# Patient Record
Sex: Female | Born: 1975 | Race: Black or African American | Hispanic: No | Marital: Single | Smoking: Never smoker
Health system: Southern US, Community
[De-identification: ages and names within clinical notes are randomized; demographics above are authoritative.]

## PROBLEM LIST (undated history)

## (undated) ENCOUNTER — Emergency Department (HOSPITAL_COMMUNITY): Admission: EM | Discharge status: Absent without leave | Payer: Managed Care, Other (non HMO)

## (undated) DIAGNOSIS — O09529 Supervision of elderly multigravida, unspecified trimester: Secondary | ICD-10-CM

## (undated) DIAGNOSIS — O139 Gestational [pregnancy-induced] hypertension without significant proteinuria, unspecified trimester: Secondary | ICD-10-CM

## (undated) DIAGNOSIS — Z9889 Other specified postprocedural states: Secondary | ICD-10-CM

## (undated) DIAGNOSIS — R569 Unspecified convulsions: Secondary | ICD-10-CM

## (undated) HISTORY — PX: FOOT SURGERY: SHX648

## (undated) HISTORY — DX: Supervision of elderly multigravida, unspecified trimester: O09.529

## (undated) HISTORY — DX: Other specified postprocedural states: Z98.890

---

## 1997-12-21 ENCOUNTER — Emergency Department (HOSPITAL_COMMUNITY): Admission: EM | Admit: 1997-12-21 | Discharge: 1997-12-21 | Payer: Self-pay | Admitting: Emergency Medicine

## 1997-12-25 ENCOUNTER — Encounter: Admission: RE | Admit: 1997-12-25 | Discharge: 1997-12-25 | Payer: Self-pay | Admitting: *Deleted

## 1998-09-12 ENCOUNTER — Emergency Department (HOSPITAL_COMMUNITY): Admission: EM | Admit: 1998-09-12 | Discharge: 1998-09-12 | Payer: Self-pay | Admitting: Emergency Medicine

## 1998-11-18 ENCOUNTER — Emergency Department (HOSPITAL_COMMUNITY): Admission: EM | Admit: 1998-11-18 | Discharge: 1998-11-18 | Payer: Self-pay | Admitting: Emergency Medicine

## 1998-11-18 ENCOUNTER — Encounter: Payer: Self-pay | Admitting: Emergency Medicine

## 1999-10-12 ENCOUNTER — Emergency Department (HOSPITAL_COMMUNITY): Admission: EM | Admit: 1999-10-12 | Discharge: 1999-10-12 | Payer: Self-pay | Admitting: Emergency Medicine

## 1999-12-14 ENCOUNTER — Encounter: Payer: Self-pay | Admitting: Emergency Medicine

## 1999-12-14 ENCOUNTER — Emergency Department (HOSPITAL_COMMUNITY): Admission: EM | Admit: 1999-12-14 | Discharge: 1999-12-15 | Payer: Self-pay | Admitting: Emergency Medicine

## 2000-02-09 ENCOUNTER — Emergency Department (HOSPITAL_COMMUNITY): Admission: EM | Admit: 2000-02-09 | Discharge: 2000-02-09 | Payer: Self-pay | Admitting: Emergency Medicine

## 2000-05-22 ENCOUNTER — Emergency Department (HOSPITAL_COMMUNITY): Admission: EM | Admit: 2000-05-22 | Discharge: 2000-05-22 | Payer: Self-pay | Admitting: Emergency Medicine

## 2000-09-18 ENCOUNTER — Encounter: Payer: Self-pay | Admitting: Emergency Medicine

## 2000-09-18 ENCOUNTER — Emergency Department (HOSPITAL_COMMUNITY): Admission: EM | Admit: 2000-09-18 | Discharge: 2000-09-18 | Payer: Self-pay | Admitting: Emergency Medicine

## 2001-02-27 ENCOUNTER — Encounter: Payer: Self-pay | Admitting: Emergency Medicine

## 2001-02-27 ENCOUNTER — Emergency Department (HOSPITAL_COMMUNITY): Admission: EM | Admit: 2001-02-27 | Discharge: 2001-02-27 | Payer: Self-pay | Admitting: Emergency Medicine

## 2001-09-20 ENCOUNTER — Other Ambulatory Visit: Admission: RE | Admit: 2001-09-20 | Discharge: 2001-09-20 | Payer: Self-pay | Admitting: Obstetrics and Gynecology

## 2002-10-21 ENCOUNTER — Emergency Department (HOSPITAL_COMMUNITY): Admission: EM | Admit: 2002-10-21 | Discharge: 2002-10-21 | Payer: Self-pay | Admitting: *Deleted

## 2002-10-22 ENCOUNTER — Other Ambulatory Visit: Admission: RE | Admit: 2002-10-22 | Discharge: 2002-10-22 | Payer: Self-pay | Admitting: *Deleted

## 2003-12-22 ENCOUNTER — Other Ambulatory Visit: Admission: RE | Admit: 2003-12-22 | Discharge: 2003-12-22 | Payer: Self-pay | Admitting: Obstetrics and Gynecology

## 2004-04-14 ENCOUNTER — Emergency Department (HOSPITAL_COMMUNITY): Admission: EM | Admit: 2004-04-14 | Discharge: 2004-04-14 | Payer: Self-pay | Admitting: Emergency Medicine

## 2004-09-02 ENCOUNTER — Emergency Department (HOSPITAL_COMMUNITY): Admission: EM | Admit: 2004-09-02 | Discharge: 2004-09-02 | Payer: Self-pay | Admitting: Emergency Medicine

## 2005-02-15 ENCOUNTER — Other Ambulatory Visit: Admission: RE | Admit: 2005-02-15 | Discharge: 2005-02-15 | Payer: Self-pay | Admitting: Obstetrics and Gynecology

## 2005-06-28 ENCOUNTER — Emergency Department (HOSPITAL_COMMUNITY): Admission: EM | Admit: 2005-06-28 | Discharge: 2005-06-28 | Payer: Self-pay | Admitting: Emergency Medicine

## 2008-06-29 ENCOUNTER — Emergency Department (HOSPITAL_COMMUNITY): Admission: EM | Admit: 2008-06-29 | Discharge: 2008-06-29 | Payer: Self-pay | Admitting: Family Medicine

## 2009-09-20 ENCOUNTER — Encounter: Admission: RE | Admit: 2009-09-20 | Discharge: 2009-09-20 | Payer: Self-pay | Admitting: Family Medicine

## 2011-06-06 LAB — POCT URINALYSIS DIP (DEVICE)
Bilirubin Urine: NEGATIVE
Hgb urine dipstick: NEGATIVE
Nitrite: NEGATIVE
Operator id: 12649
Protein, ur: NEGATIVE

## 2011-06-06 LAB — COMPREHENSIVE METABOLIC PANEL
AST: 16
Albumin: 3.8
BUN: 8
Calcium: 9.2
Creatinine, Ser: 0.65
GFR calc Af Amer: >60
Glucose, Bld: 90
Sodium: 140

## 2011-06-06 LAB — POCT I-STAT, CHEM 8
HCT: 39
Hemoglobin: 13.3
Potassium: 4.1
TCO2: 25

## 2011-06-06 LAB — DIFFERENTIAL
Basophils Relative: 0
Lymphocytes Relative: 27
Monocytes Absolute: 0.7
Monocytes Relative: 6
Neutro Abs: 7.9 — ABNORMAL HIGH

## 2011-06-06 LAB — CBC
MCV: 83.2
RBC: 4.53
RDW: 13.6
WBC: 12 — ABNORMAL HIGH

## 2011-06-06 LAB — LIPASE, BLOOD: Lipase: 22

## 2011-06-06 LAB — POCT PREGNANCY, URINE: Preg Test, Ur: NEGATIVE

## 2011-10-31 ENCOUNTER — Emergency Department (HOSPITAL_COMMUNITY)
Admission: EM | Admit: 2011-10-31 | Discharge: 2011-10-31 | Disposition: A | Discharge status: Home Health | Payer: Self-pay | Attending: Emergency Medicine | Admitting: Emergency Medicine

## 2011-10-31 ENCOUNTER — Encounter (HOSPITAL_COMMUNITY): Payer: Self-pay

## 2011-10-31 DIAGNOSIS — R51 Headache: Secondary | ICD-10-CM | POA: Insufficient documentation

## 2011-10-31 DIAGNOSIS — Z79899 Other long term (current) drug therapy: Secondary | ICD-10-CM | POA: Insufficient documentation

## 2011-10-31 DIAGNOSIS — J329 Chronic sinusitis, unspecified: Secondary | ICD-10-CM

## 2011-10-31 DIAGNOSIS — R209 Unspecified disturbances of skin sensation: Secondary | ICD-10-CM | POA: Insufficient documentation

## 2011-10-31 DIAGNOSIS — H5789 Other specified disorders of eye and adnexa: Secondary | ICD-10-CM | POA: Insufficient documentation

## 2011-10-31 DIAGNOSIS — R0609 Other forms of dyspnea: Secondary | ICD-10-CM | POA: Insufficient documentation

## 2011-10-31 DIAGNOSIS — R0989 Other specified symptoms and signs involving the circulatory and respiratory systems: Secondary | ICD-10-CM | POA: Insufficient documentation

## 2011-10-31 DIAGNOSIS — G44209 Tension-type headache, unspecified, not intractable: Secondary | ICD-10-CM

## 2011-10-31 DIAGNOSIS — J3489 Other specified disorders of nose and nasal sinuses: Secondary | ICD-10-CM | POA: Insufficient documentation

## 2011-10-31 MED ORDER — IBUPROFEN 800 MG PO TABS: 800.0000 mg | ORAL_TABLET | Freq: Once | ORAL | Status: DC

## 2011-10-31 MED ORDER — LORATADINE 10 MG PO TABS: 10.0000 mg | ORAL_TABLET | Freq: Every day | ORAL | Status: DC

## 2011-10-31 MED ORDER — AZITHROMYCIN 250 MG PO TABS: 250.0000 mg | ORAL_TABLET | Freq: Every day | ORAL | Status: AC
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
## 2011-10-31 NOTE — ED Notes (Signed)
Also report pressure in ear, nauseous, no vomiting

## 2011-10-31 NOTE — Discharge Instructions (Signed)
Sinusitis Sinuses are air pockets within the bones of your face. The growth of bacteria within a sinus leads to infection. The infection prevents the sinuses from draining. This infection is called sinusitis. SYMPTOMS  There will be different areas of pain depending on which sinuses have become infected.  The maxillary sinuses often produce pain beneath the eyes.   Frontal sinusitis may cause pain in the middle of the forehead and above the eyes.  Other problems (symptoms) include:  Toothaches.   Colored, pus-like (purulent) drainage from the nose.   Swelling, warmth, and tenderness over the sinus areas may be signs of infection.  TREATMENT  Sinusitis is most often determined by an exam.X-rays may be taken. If x-rays have been taken, make sure you obtain your results or find out how you are to obtain them. Your caregiver may give you medications (antibiotics). These are medications that will help kill the bacteria causing the infection. You may also be given a medication (decongestant) that helps to reduce sinus swelling.  HOME CARE INSTRUCTIONS   Only take over-the-counter or prescription medicines for pain, discomfort, or fever as directed by your caregiver.   Drink extra fluids. Fluids help thin the mucus so your sinuses can drain more easily.   Applying either moist heat or ice packs to the sinus areas may help relieve discomfort.   Use saline nasal sprays to help moisten your sinuses. The sprays can be found at your local drugstore.  SEEK IMMEDIATE MEDICAL CARE IF:  You have a fever.   You have increasing pain, severe headaches, or toothache.   You have nausea, vomiting, or drowsiness.   You develop unusual swelling around the face or trouble seeing.  MAKE SURE YOU:   Understand these instructions.   Will watch your condition.   Will get help right away if you are not doing well or get worse.  Document Released: 08/21/2005 Document Revised: 05/03/2011 Document Reviewed:  03/20/2007 [facility] Patient Information 2012 [place], [place].    Tension Headache (Muscle Contraction Headache) Tension headache is one of the most common causes of head pain. These headaches are usually felt as a pain over the top of your head and back of your neck. Stress, anxiety, and depression are common triggers for these headaches. Tension headaches are not life-threatening and will not lead to other types of headaches. Tension headaches can often be diagnosed by taking a history from the patient and a physical exam. Sometimes, further lab and x-ray studies are used to confirm the diagnosis. Your caregiver can advise you on how to get help solving problems that cause anxiety or stress.  HOME CARE INSTRUCTIONS   If testing was done, call for your results. Remember, it is your responsibility to get the results of all testing. Do not assume everything is fine because you do not hear from your caregiver.   Only take over-the-counter or prescription medicines for pain, discomfort, or fever as directed by your caregiver.   Biofeedback, massage, or other relaxation techniques may be helpful.   Ice packs or heat to the head and neck can be used. Use these three to four times per day or as needed.   Physical therapy may be a useful addition to treatment.   If headaches continue, even with therapy, you may need to think about lifestyle changes.   Avoid excessive use of pain killers, as rebound headaches can occur.  SEEK MEDICAL CARE IF:   You develop problems with medications prescribed.   You do not  respond or get no relief from medications.   You have a change from the usual headache.   You develop nausea (feeling sick to your stomach) or vomiting.  SEEK IMMEDIATE MEDICAL CARE IF:   Your headache becomes severe.   You have an unexplained oral temperature above 102 F (38.9 C).   You develop a stiff neck.   You have loss of vision.   You have muscular weakness.   You have  loss of muscular control.   You develop severe symptoms different from your first symptoms.   You start losing your balance or have trouble walking.   You feel faint or pass out.  MAKE SURE YOU:   Understand these instructions.   Will watch your condition.   Will get help right away if you are not doing well or get worse.  Document Released: 08/21/2005 Document Revised: 05/03/2011 Document Reviewed: 04/09/2008 [facility] Patient Information 2012 [place], [place].

## 2011-10-31 NOTE — ED Notes (Signed)
Pt reports she may have sinusitis, sts she has cold sensation on her frontal area x 2-3 month. Denied headache. Report vision changes today.

## 2012-12-12 ENCOUNTER — Other Ambulatory Visit: Payer: Self-pay | Admitting: Gastroenterology

## 2012-12-12 DIAGNOSIS — R109 Unspecified abdominal pain: Secondary | ICD-10-CM

## 2012-12-19 ENCOUNTER — Ambulatory Visit
Admission: RE | Admit: 2012-12-19 | Discharge: 2012-12-19 | Disposition: A | Discharge status: Home / Self Care | Payer: Managed Care, Other (non HMO) | Source: Ambulatory Visit | Attending: Gastroenterology | Admitting: Gastroenterology

## 2012-12-19 DIAGNOSIS — R109 Unspecified abdominal pain: Secondary | ICD-10-CM

## 2013-09-04 NOTE — L&D Delivery Note (Signed)
Pt presented in labor. She wanted to attempt a VBAC. She rapidly progressed to C/C/+2. She had SROM with mod mec . She had occ variable decels with labor. She pushed briefly and had a SVD of one live viable infant in the ROA postion over an intact perineum. She had a left labial tear repaired with 3-0 chromic. Placenta S/I . EBL-400cc. Baby to NBN.

## 2013-11-13 ENCOUNTER — Other Ambulatory Visit: Payer: Self-pay | Admitting: Obstetrics and Gynecology

## 2013-11-13 DIAGNOSIS — N6320 Unspecified lump in the left breast, unspecified quadrant: Secondary | ICD-10-CM

## 2013-11-13 DIAGNOSIS — N644 Mastodynia: Secondary | ICD-10-CM

## 2013-11-24 ENCOUNTER — Ambulatory Visit
Admission: RE | Admit: 2013-11-24 | Discharge: 2013-11-24 | Disposition: A | Discharge status: Home / Self Care | Payer: Managed Care, Other (non HMO) | Source: Ambulatory Visit | Attending: Obstetrics and Gynecology | Admitting: Obstetrics and Gynecology

## 2013-11-24 DIAGNOSIS — N6320 Unspecified lump in the left breast, unspecified quadrant: Secondary | ICD-10-CM

## 2013-11-24 DIAGNOSIS — N644 Mastodynia: Secondary | ICD-10-CM

## 2014-01-28 LAB — OB RESULTS CONSOLE ABO/RH: RH Type: POSITIVE

## 2014-01-28 LAB — OB RESULTS CONSOLE ANTIBODY SCREEN: Antibody Screen: NEGATIVE

## 2014-01-28 LAB — OB RESULTS CONSOLE HEPATITIS B SURFACE ANTIGEN: Hepatitis B Surface Ag: NEGATIVE

## 2014-01-28 LAB — OB RESULTS CONSOLE RUBELLA ANTIBODY, IGM: Rubella: IMMUNE

## 2014-01-28 LAB — OB RESULTS CONSOLE RPR: RPR: NONREACTIVE

## 2014-01-28 LAB — OB RESULTS CONSOLE HIV ANTIBODY (ROUTINE TESTING): HIV: NONREACTIVE

## 2014-05-19 LAB — OB RESULTS CONSOLE RPR: RPR: NONREACTIVE

## 2014-08-07 LAB — OB RESULTS CONSOLE GBS: GBS: NEGATIVE

## 2014-08-18 ENCOUNTER — Inpatient Hospital Stay (HOSPITAL_COMMUNITY)
Admission: AD | Admit: 2014-08-18 | Discharge: 2014-08-20 | DRG: 775 | Disposition: A | Discharge status: Home / Self Care | Payer: Managed Care, Other (non HMO) | Source: Ambulatory Visit | Attending: Obstetrics and Gynecology | Admitting: Obstetrics and Gynecology

## 2014-08-18 ENCOUNTER — Encounter (HOSPITAL_COMMUNITY): Payer: Self-pay

## 2014-08-18 DIAGNOSIS — Z3480 Encounter for supervision of other normal pregnancy, unspecified trimester: Secondary | ICD-10-CM

## 2014-08-18 DIAGNOSIS — O09523 Supervision of elderly multigravida, third trimester: Secondary | ICD-10-CM | POA: Diagnosis not present

## 2014-08-18 DIAGNOSIS — O3421 Maternal care for scar from previous cesarean delivery: Secondary | ICD-10-CM | POA: Diagnosis present

## 2014-08-18 DIAGNOSIS — IMO0001 Reserved for inherently not codable concepts without codable children: Secondary | ICD-10-CM

## 2014-08-18 DIAGNOSIS — Z3483 Encounter for supervision of other normal pregnancy, third trimester: Secondary | ICD-10-CM

## 2014-08-18 DIAGNOSIS — O34219 Maternal care for unspecified type scar from previous cesarean delivery: Secondary | ICD-10-CM

## 2014-08-18 DIAGNOSIS — Z3A39 39 weeks gestation of pregnancy: Secondary | ICD-10-CM | POA: Diagnosis present

## 2014-08-18 HISTORY — DX: Gestational (pregnancy-induced) hypertension without significant proteinuria, unspecified trimester: O13.9

## 2014-08-18 HISTORY — DX: Unspecified convulsions: R56.9

## 2014-08-18 LAB — CBC
HCT: 40 % (ref 36.0–46.0)
HEMOGLOBIN: 13.4 g/dL (ref 12.0–15.0)
MCH: 28.9 pg (ref 26.0–34.0)
MCHC: 33.5 g/dL (ref 30.0–36.0)
MCV: 86.4 fL (ref 78.0–100.0)
Platelets: 248 10*3/uL (ref 150–400)
RBC: 4.63 MIL/uL (ref 3.87–5.11)
RDW: 14.1 % (ref 11.5–15.5)
WBC: 18.4 10*3/uL — AB (ref 4.0–10.5)

## 2014-08-18 LAB — ABO/RH: ABO/RH(D): O POS

## 2014-08-18 LAB — RPR

## 2014-08-18 LAB — TYPE AND SCREEN
ABO/RH(D): O POS
Antibody Screen: NEGATIVE

## 2014-08-18 MED ORDER — OXYTOCIN BOLUS FROM INFUSION
500.0000 mL | INTRAVENOUS | Status: DC
  Administered 2014-08-18: 500 mL via INTRAVENOUS

## 2014-08-18 MED ORDER — OXYCODONE-ACETAMINOPHEN 5-325 MG PO TABS: 2.0000 | ORAL_TABLET | ORAL | Status: DC | PRN

## 2014-08-18 MED ORDER — DIBUCAINE 1 % RE OINT
1.0000 "application " | TOPICAL_OINTMENT | RECTAL | Status: DC | PRN
  Administered 2014-08-20: 1 via RECTAL
  Filled 2014-08-18: qty 28

## 2014-08-18 MED ORDER — ONDANSETRON HCL 4 MG/2ML IJ SOLN
4.0000 mg | INTRAMUSCULAR | Status: DC | PRN
Start: 2014-08-18 — End: 2014-08-20

## 2014-08-18 MED ORDER — FLEET ENEMA 7-19 GM/118ML RE ENEM: 1.0000 | ENEMA | RECTAL | Status: DC | PRN

## 2014-08-18 MED ORDER — TETANUS-DIPHTH-ACELL PERTUSSIS 5-2.5-18.5 LF-MCG/0.5 IM SUSP: 0.5000 mL | Freq: Once | INTRAMUSCULAR | Status: DC

## 2014-08-18 MED ORDER — SIMETHICONE 80 MG PO CHEW: 80.0000 mg | CHEWABLE_TABLET | ORAL | Status: DC | PRN

## 2014-08-18 MED ORDER — ONDANSETRON HCL 4 MG/2ML IJ SOLN: 4.0000 mg | Freq: Four times a day (QID) | INTRAMUSCULAR | Status: DC | PRN

## 2014-08-18 MED ORDER — ACETAMINOPHEN 325 MG PO TABS: 650.0000 mg | ORAL_TABLET | ORAL | Status: DC | PRN

## 2014-08-18 MED ORDER — LACTATED RINGERS IV SOLN
INTRAVENOUS | Status: DC
  Administered 2014-08-18: 12:00:00 via INTRAVENOUS

## 2014-08-18 MED ORDER — CITRIC ACID-SODIUM CITRATE 334-500 MG/5ML PO SOLN: 30.0000 mL | ORAL | Status: DC | PRN

## 2014-08-18 MED ORDER — ZOLPIDEM TARTRATE 5 MG PO TABS: 5.0000 mg | ORAL_TABLET | Freq: Every evening | ORAL | Status: DC | PRN

## 2014-08-18 MED ORDER — LIDOCAINE HCL (PF) 1 % IJ SOLN
30.0000 mL | INTRAMUSCULAR | Status: DC | PRN
  Administered 2014-08-18: 30 mL via SUBCUTANEOUS
  Filled 2014-08-18: qty 30

## 2014-08-18 MED ORDER — MEASLES, MUMPS & RUBELLA VAC ~~LOC~~ INJ: 0.5000 mL | INJECTION | Freq: Once | SUBCUTANEOUS | Status: DC

## 2014-08-18 MED ORDER — BENZOCAINE-MENTHOL 20-0.5 % EX AERO
1.0000 "application " | INHALATION_SPRAY | CUTANEOUS | Status: DC | PRN
  Administered 2014-08-18 – 2014-08-20 (×2): 1 via TOPICAL
  Filled 2014-08-18 (×3): qty 56

## 2014-08-18 MED ORDER — IBUPROFEN 600 MG PO TABS
600.0000 mg | ORAL_TABLET | Freq: Four times a day (QID) | ORAL | Status: DC
  Administered 2014-08-18 – 2014-08-20 (×7): 600 mg via ORAL
  Filled 2014-08-18 (×8): qty 1

## 2014-08-18 MED ORDER — LACTATED RINGERS IV SOLN: 500.0000 mL | INTRAVENOUS | Status: DC | PRN

## 2014-08-18 MED ORDER — OXYCODONE-ACETAMINOPHEN 5-325 MG PO TABS
1.0000 | ORAL_TABLET | ORAL | Status: DC | PRN
  Administered 2014-08-19 – 2014-08-20 (×2): 1 via ORAL
  Filled 2014-08-18 (×2): qty 1

## 2014-08-18 MED ORDER — SENNOSIDES-DOCUSATE SODIUM 8.6-50 MG PO TABS
2.0000 | ORAL_TABLET | ORAL | Status: DC
  Administered 2014-08-19 (×2): 2 via ORAL
  Filled 2014-08-18 (×2): qty 2

## 2014-08-18 MED ORDER — OXYCODONE-ACETAMINOPHEN 5-325 MG PO TABS: 1.0000 | ORAL_TABLET | ORAL | Status: DC | PRN

## 2014-08-18 MED ORDER — OXYTOCIN 40 UNITS IN LACTATED RINGERS INFUSION - SIMPLE MED
62.5000 mL/h | INTRAVENOUS | Status: DC
  Administered 2014-08-18: 62.5 mL/h via INTRAVENOUS
  Filled 2014-08-18: qty 1000

## 2014-08-18 MED ORDER — ONDANSETRON HCL 4 MG PO TABS: 4.0000 mg | ORAL_TABLET | ORAL | Status: DC | PRN

## 2014-08-18 MED ORDER — WITCH HAZEL-GLYCERIN EX PADS: 1.0000 "application " | MEDICATED_PAD | CUTANEOUS | Status: DC | PRN

## 2014-08-18 NOTE — Lactation Note (Signed)
This note was copied from the chart of Becky Knight. Lactation Consultation Note  Patient Name: Becky Stephenie AcresMaria Canelo JXBJY'NToday's Date: 08/18/2014 Reason for consult: Initial assessment of this mom and baby at 7 hours pp.  Baby has latched one time since birth for 10 minutes and LATCH score=6.  Mom has an 38 yo but she has no previous breastfeeding experience.  Her newborn has latched since birth but is asleep and STS at time of LC visit.  LC demonstrated hand expression and encouraged frequent STS and cue feedings.  LC also discussed normal newborn sleepiness during first 24 hours. Mom encouraged to feed baby 8-12 times/24 hours and with feeding cues. LC encouraged review of Baby and Me pp 9, 14 and 20-25 for STS and BF information. LC provided [employer] Resource brochure and reviewed [facility] services and list of community and web site resources.    Maternal Data Formula Feeding for Exclusion: Yes Reason for exclusion: Mother's choice to formula and breast feed on admission Has patient been taught Hand Expression?: Yes (LC demonstrated) Does the patient have breastfeeding experience prior to this delivery?: No  Feeding    LATCH Score/Interventions        LATCH score=6 at first attempt; baby sleepy              Lactation Tools Discussed/Used   STS, hand expression, cue feedings  Consult Status Date: 08/19/14 Follow-up type: In-patient    Warrick ParisianBryant, Stone Spirito [facility] 08/18/2014, 8:46 PM

## 2014-08-18 NOTE — MAU Note (Signed)
Urine in lab 

## 2014-08-18 NOTE — H&P (Signed)
Pt is a 38 y/o black female G2P1001 at term who presents to L&D c/o labor. On admission she was 5 cm. She desires to attempt a VBAC. PNC was complicated by AMA. She had a normal NIPT. PMHX : See prenatal record. PE: VSSAF HEENT-wnl ABD- gravid, soft, nontender. IMP/ IUP at term with labor.         H.O. Prior C[facility]S, [facility]

## 2014-08-18 NOTE — Progress Notes (Signed)
Joana ReamerSara Beck RN triaged pt in room. When efm applied FHR in 70s. Pt to L side and FHR immed up to 140s.

## 2014-08-18 NOTE — Progress Notes (Signed)
Dr Dareen PianoAnderson notified of pt's admission and status. Aware of ctx pattern, sve, hx C/S for eclamptic seizure although has never had elevated b/ps with either pregnancy. Aware of flat fhr strip with variables. Admit to BS

## 2014-08-18 NOTE — Progress Notes (Signed)
[facility]. Pt to BS via stretcher. Strip reviewed by [facility]

## 2014-08-19 NOTE — Progress Notes (Signed)
Patient is eating, ambulating, voiding.  Pain control is good.  No complaints, appropriate lochia.  Filed Vitals:   08/18/14 1610 08/18/14 1817 08/18/14 2127 08/19/14 0444  BP: 122/64 117/57 122/65 117/61  Pulse: 91 83 80 80  Temp: 98.8 F (37.1 C) 99.1 F (37.3 C) 98.9 F (37.2 C) 98.5 F (36.9 C)  TempSrc: Oral Oral Oral Oral  Resp: 20 20 20 18   Height:      Weight:      SpO2:   98% 99%    Fundus firm Perineum without swelling. No CT  Lab Results  Component Value Date   WBC 18.4* 08/18/2014   HGB 13.4 08/18/2014   HCT 40.0 08/18/2014   MCV 86.4 08/18/2014   PLT 248 08/18/2014    --/--/O POS (12/15 1611)  A/P Post partum day 1.  Routine care.  Per pt peds wants her to stay until tomorrow.   Philip AspenALLAHAN, Akeya Ryther

## 2014-08-20 MED ORDER — IBUPROFEN 600 MG PO TABS: 600.0000 mg | ORAL_TABLET | Freq: Four times a day (QID) | ORAL | Status: DC | PRN

## 2014-08-20 MED ORDER — OXYCODONE-ACETAMINOPHEN 5-325 MG PO TABS: 2.0000 | ORAL_TABLET | ORAL | Status: DC | PRN

## 2014-08-20 MED ORDER — DOCUSATE SODIUM 100 MG PO CAPS: 100.0000 mg | ORAL_CAPSULE | Freq: Two times a day (BID) | ORAL | Status: DC

## 2014-08-20 MED ORDER — BENZOCAINE-MENTHOL 20-0.5 % EX AERO: 1.0000 "application " | INHALATION_SPRAY | Freq: Four times a day (QID) | CUTANEOUS | Status: DC | PRN

## 2014-08-20 NOTE — Discharge Summary (Signed)
Obstetric Discharge Summary Reason for Admission: onset of labor Prenatal Procedures: NST and ultrasound Intrapartum Procedures: spontaneous vaginal delivery Postpartum Procedures: none Complications-Operative and Postpartum: left labial laceration HEMOGLOBIN  Date Value Ref Range Status  08/18/2014 13.4 12.0 - 15.0 g/dL Final   HCT  Date Value Ref Range Status  08/18/2014 40.0 36.0 - 46.0 % Final    Physical Exam:  General: alert, cooperative and appears stated age 71Lochia: appropriate Uterine Fundus: firm  Discharge Diagnoses: Term Pregnancy-delivered  Discharge Information: Date: 08/20/2014 Activity: pelvic rest Diet: routine Medications: Ibuprofen, Colace and Percocet Condition: improved Instructions: refer to practice specific booklet Discharge to: home Follow-up Information    Follow up with Levi AlandANDERSON,MARK E, MD In 4 weeks.   Specialty:  Obstetrics and Gynecology   Why:  Follow-up in 4-6 weeks for a postpartum evaluation   Contact information:   [address] [place] [id number] [phone]       Newborn Data: Live born female  Birth Weight: 6 lb 3.6 oz (2824 g) APGAR: 8, 9  Home with mother.  Hakeen Shipes H. 08/20/2014, 9:52 AM

## 2014-08-20 NOTE — Lactation Note (Signed)
This note was copied from the chart of Becky Knight. Lactation Consultation Note: Mom has baby latched to breast when i went into room. Reports she is having a little trouble getting latched to the right. Assisted with latch and baby nursing well when i left room. Has Medela pump at home. Reviewed engorgement prevention and treatment. No questions at present. To call prn  Patient Name: Becky Knight UJWJX'BToday's Date: 08/20/2014 Reason for consult: Follow-up assessment   Maternal Data Formula Feeding for Exclusion: Yes Reason for exclusion: Mother's choice to formula and breast feed on admission Does the patient have breastfeeding experience prior to this delivery?: No  Feeding Feeding Type: Breast Fed  LATCH Score/Interventions Latch: Grasps breast easily, tongue down, lips flanged, rhythmical sucking.  Audible Swallowing: A few with stimulation  Type of Nipple: Everted at rest and after stimulation  Comfort (Breast/Nipple): Soft / non-tender     Hold (Positioning): No assistance needed to correctly position infant at breast. Intervention(s): Breastfeeding basics reviewed;Position options  LATCH Score: 9  Lactation Tools Discussed/Used     Consult Status Consult Status: Complete    Pamelia HoitWeeks, Hayla Hinger D 08/20/2014, 8:41 AM

## 2015-11-01 ENCOUNTER — Ambulatory Visit (INDEPENDENT_AMBULATORY_CARE_PROVIDER_SITE_OTHER): Payer: Managed Care, Other (non HMO) | Admitting: Physician Assistant

## 2015-11-01 ENCOUNTER — Ambulatory Visit (HOSPITAL_COMMUNITY)
Admission: RE | Admit: 2015-11-01 | Discharge: 2015-11-01 | Disposition: A | Discharge status: Home / Self Care | Payer: Managed Care, Other (non HMO) | Source: Ambulatory Visit | Attending: Physician Assistant | Admitting: Physician Assistant

## 2015-11-01 VITALS — BP 124/70 | HR 98 | Temp 98.8°F | Resp 18 | Ht 65.0 in | Wt 142.0 lb

## 2015-11-01 DIAGNOSIS — R198 Other specified symptoms and signs involving the digestive system and abdomen: Secondary | ICD-10-CM | POA: Diagnosis not present

## 2015-11-01 DIAGNOSIS — N854 Malposition of uterus: Secondary | ICD-10-CM | POA: Insufficient documentation

## 2015-11-01 DIAGNOSIS — R1033 Periumbilical pain: Secondary | ICD-10-CM

## 2015-11-01 LAB — POCT CBC
Granulocyte percent: 66.1 %G (ref 37–80)
HCT, POC: 38.1 % (ref 37.7–47.9)
Hemoglobin: 13.3 g/dL (ref 12.2–16.2)
LYMPH, POC: 2.7 (ref 0.6–3.4)
MCH, POC: 29.1 pg (ref 27–31.2)
MCHC: 34.9 g/dL (ref 31.8–35.4)
MCV: 83.4 fL (ref 80–97)
MID (cbc): 0.4 (ref 0–0.9)
MPV: 7.8 fL (ref 0–99.8)
POC GRANULOCYTE: 6.1 (ref 2–6.9)
POC LYMPH %: 29.1 % (ref 10–50)
POC MID %: 4.8 %M (ref 0–12)
Platelet Count, POC: 230 10*3/uL (ref 142–424)
RBC: 4.56 M/uL (ref 4.04–5.48)
RDW, POC: 14.3 %
WBC: 9.2 10*3/uL (ref 4.6–10.2)

## 2015-11-01 LAB — POCT URINALYSIS DIP (MANUAL ENTRY)
BILIRUBIN UA: NEGATIVE
Bilirubin, UA: NEGATIVE
Blood, UA: NEGATIVE
Glucose, UA: NEGATIVE
NITRITE UA: NEGATIVE
PROTEIN UA: NEGATIVE
Spec Grav, UA: 1.02
Urobilinogen, UA: 0.2
pH, UA: 7

## 2015-11-01 LAB — POCT URINE PREGNANCY: Preg Test, Ur: NEGATIVE

## 2015-11-01 MED ORDER — IOHEXOL 300 MG/ML  SOLN
100.0000 mL | Freq: Once | INTRAMUSCULAR | Status: AC | PRN
  Administered 2015-11-01: 100 mL via INTRAVENOUS
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
## 2015-11-01 NOTE — Patient Instructions (Signed)
Please report to [facility]. Please go through ER and register as an OUTPATIENT for your CT scan. They are expecting you now.

## 2015-11-02 NOTE — Addendum Note (Signed)
Addended by: Garfield Cornea L on: 11/02/2015 10:56 AM   Modules accepted: Orders

## 2015-11-03 ENCOUNTER — Other Ambulatory Visit (INDEPENDENT_AMBULATORY_CARE_PROVIDER_SITE_OTHER): Payer: Managed Care, Other (non HMO)

## 2015-11-03 DIAGNOSIS — R198 Other specified symptoms and signs involving the digestive system and abdomen: Secondary | ICD-10-CM

## 2015-11-03 DIAGNOSIS — R1033 Periumbilical pain: Secondary | ICD-10-CM

## 2015-11-03 LAB — COMPLETE METABOLIC PANEL WITH GFR
ALT: 9 U/L (ref 6–29)
AST: 13 U/L (ref 10–30)
Albumin: 4.6 g/dL (ref 3.6–5.1)
Alkaline Phosphatase: 76 U/L (ref 33–115)
BILIRUBIN TOTAL: 0.4 mg/dL (ref 0.2–1.2)
BUN: 14 mg/dL (ref 7–25)
CO2: 25 mmol/L (ref 20–31)
Calcium: 9.3 mg/dL (ref 8.6–10.2)
Chloride: 106 mmol/L (ref 98–110)
Creat: 0.75 mg/dL (ref 0.50–1.10)
GFR, Est African American: >89 mL/min (ref >=60)
GFR, Est Non African American: >89 mL/min (ref >=60)
GLUCOSE: 99 mg/dL (ref 65–99)
Potassium: 4.1 mmol/L (ref 3.5–5.3)
SODIUM: 139 mmol/L (ref 135–146)
TOTAL PROTEIN: 7.1 g/dL (ref 6.1–8.1)

## 2015-11-03 LAB — LIPASE: Lipase: 22 U/L (ref 7–60)

## 2015-11-04 ENCOUNTER — Encounter: Payer: Self-pay | Admitting: *Deleted

## 2016-09-04 NOTE — L&D Delivery Note (Signed)
Pt was admitted for an induction secondary to post term preg. She had an amniotomy with clear fluid. She had pit aug started later in the day. She developed occ decels when she rapidly progressed from 4-10cm She pushed for 15 min She had a svd of one live viable female infant over an intact perineum . Placenta S/I. Nuchal cord x 1. Baby to Nbn

## 2016-11-20 LAB — OB RESULTS CONSOLE GC/CHLAMYDIA
Chlamydia: NEGATIVE
Gonorrhea: NEGATIVE

## 2016-11-20 LAB — OB RESULTS CONSOLE ANTIBODY SCREEN: Antibody Screen: NEGATIVE

## 2016-11-20 LAB — OB RESULTS CONSOLE HEPATITIS B SURFACE ANTIGEN: Hepatitis B Surface Ag: NEGATIVE

## 2016-11-20 LAB — OB RESULTS CONSOLE ABO/RH: RH TYPE: POSITIVE

## 2016-11-20 LAB — OB RESULTS CONSOLE RPR: RPR: NONREACTIVE

## 2016-11-20 LAB — OB RESULTS CONSOLE HIV ANTIBODY (ROUTINE TESTING): HIV: NONREACTIVE

## 2016-11-20 LAB — OB RESULTS CONSOLE RUBELLA ANTIBODY, IGM: Rubella: IMMUNE

## 2017-04-12 LAB — OB RESULTS CONSOLE GBS: STREP GROUP B AG: NEGATIVE

## 2017-04-24 ENCOUNTER — Inpatient Hospital Stay (HOSPITAL_COMMUNITY)
Admission: AD | Admit: 2017-04-24 | Payer: Managed Care, Other (non HMO) | Source: Ambulatory Visit | Admitting: Obstetrics and Gynecology

## 2017-05-15 ENCOUNTER — Other Ambulatory Visit: Payer: Self-pay | Admitting: Obstetrics & Gynecology

## 2017-05-16 ENCOUNTER — Encounter (HOSPITAL_COMMUNITY): Payer: Self-pay | Admitting: *Deleted

## 2017-05-16 ENCOUNTER — Telehealth (HOSPITAL_COMMUNITY): Payer: Self-pay | Admitting: *Deleted

## 2017-05-16 NOTE — Telephone Encounter (Signed)
Preadmission screen  

## 2017-05-17 ENCOUNTER — Encounter (HOSPITAL_COMMUNITY): Payer: Self-pay

## 2017-05-17 ENCOUNTER — Inpatient Hospital Stay (HOSPITAL_COMMUNITY)
Admission: RE | Admit: 2017-05-17 | Discharge: 2017-05-19 | DRG: 775 | Disposition: A | Discharge status: Home / Self Care | Payer: Managed Care, Other (non HMO) | Source: Ambulatory Visit | Attending: Obstetrics and Gynecology | Admitting: Obstetrics and Gynecology

## 2017-05-17 VITALS — BP 103/62 | HR 78 | Temp 97.9°F | Resp 18 | Ht 65.5 in | Wt 194.0 lb

## 2017-05-17 DIAGNOSIS — Z3490 Encounter for supervision of normal pregnancy, unspecified, unspecified trimester: Secondary | ICD-10-CM

## 2017-05-17 DIAGNOSIS — O480 Post-term pregnancy: Principal | ICD-10-CM | POA: Diagnosis present

## 2017-05-17 DIAGNOSIS — O34211 Maternal care for low transverse scar from previous cesarean delivery: Secondary | ICD-10-CM | POA: Diagnosis present

## 2017-05-17 DIAGNOSIS — Z3A40 40 weeks gestation of pregnancy: Secondary | ICD-10-CM | POA: Diagnosis not present

## 2017-05-17 LAB — TYPE AND SCREEN
ABO/RH(D): O POS
Antibody Screen: NEGATIVE

## 2017-05-17 LAB — CBC
HCT: 33.6 % — ABNORMAL LOW (ref 36.0–46.0)
Hemoglobin: 11.2 g/dL — ABNORMAL LOW (ref 12.0–15.0)
MCH: 28.3 pg (ref 26.0–34.0)
MCHC: 33.3 g/dL (ref 30.0–36.0)
MCV: 84.8 fL (ref 78.0–100.0)
PLATELETS: 223 10*3/uL (ref 150–400)
RBC: 3.96 MIL/uL (ref 3.87–5.11)
RDW: 14.4 % (ref 11.5–15.5)
WBC: 12 10*3/uL — AB (ref 4.0–10.5)

## 2017-05-17 LAB — RPR: RPR Ser Ql: NONREACTIVE

## 2017-05-17 MED ORDER — LACTATED RINGERS IV SOLN
500.0000 mL | INTRAVENOUS | Status: DC | PRN
  Administered 2017-05-17: 500 mL via INTRAVENOUS

## 2017-05-17 MED ORDER — EPHEDRINE 5 MG/ML INJ
10.0000 mg | INTRAVENOUS | Status: DC | PRN
  Filled 2017-05-17: qty 2

## 2017-05-17 MED ORDER — OXYTOCIN 40 UNITS IN LACTATED RINGERS INFUSION - SIMPLE MED
1.0000 m[IU]/min | INTRAVENOUS | Status: DC
  Administered 2017-05-17: 2 m[IU]/min via INTRAVENOUS
  Filled 2017-05-17: qty 1000

## 2017-05-17 MED ORDER — LACTATED RINGERS IV SOLN
INTRAVENOUS | Status: DC
  Administered 2017-05-17 (×3): via INTRAVENOUS

## 2017-05-17 MED ORDER — ONDANSETRON HCL 4 MG/2ML IJ SOLN
4.0000 mg | Freq: Four times a day (QID) | INTRAMUSCULAR | Status: DC | PRN
Start: 2017-05-17 — End: 2017-05-18

## 2017-05-17 MED ORDER — LIDOCAINE HCL (PF) 1 % IJ SOLN
30.0000 mL | INTRAMUSCULAR | Status: DC | PRN
  Filled 2017-05-17: qty 30

## 2017-05-17 MED ORDER — OXYCODONE-ACETAMINOPHEN 5-325 MG PO TABS: 2.0000 | ORAL_TABLET | ORAL | Status: DC | PRN

## 2017-05-17 MED ORDER — OXYCODONE-ACETAMINOPHEN 5-325 MG PO TABS
1.0000 | ORAL_TABLET | ORAL | Status: DC | PRN
Start: 2017-05-17 — End: 2017-05-18

## 2017-05-17 MED ORDER — LACTATED RINGERS IV SOLN: 500.0000 mL | Freq: Once | INTRAVENOUS | Status: DC

## 2017-05-17 MED ORDER — DIPHENHYDRAMINE HCL 50 MG/ML IJ SOLN: 12.5000 mg | INTRAMUSCULAR | Status: DC | PRN

## 2017-05-17 MED ORDER — OXYTOCIN BOLUS FROM INFUSION
500.0000 mL | Freq: Once | INTRAVENOUS | Status: AC
  Administered 2017-05-17: 500 mL via INTRAVENOUS

## 2017-05-17 MED ORDER — BUTORPHANOL TARTRATE 1 MG/ML IJ SOLN
1.0000 mg | INTRAMUSCULAR | Status: DC | PRN
  Administered 2017-05-17 (×2): 1 mg via INTRAVENOUS
  Filled 2017-05-17 (×2): qty 1

## 2017-05-17 MED ORDER — PHENYLEPHRINE 40 MCG/ML (10ML) SYRINGE FOR IV PUSH (FOR BLOOD PRESSURE SUPPORT)
80.0000 ug | PREFILLED_SYRINGE | INTRAVENOUS | Status: DC | PRN
  Filled 2017-05-17: qty 5

## 2017-05-17 MED ORDER — OXYTOCIN 40 UNITS IN LACTATED RINGERS INFUSION - SIMPLE MED
2.5000 [IU]/h | INTRAVENOUS | Status: DC
  Administered 2017-05-17: 2.5 [IU]/h via INTRAVENOUS

## 2017-05-17 MED ORDER — TERBUTALINE SULFATE 1 MG/ML IJ SOLN
0.2500 mg | Freq: Once | INTRAMUSCULAR | Status: AC | PRN
  Administered 2017-05-17: 0.25 mg via SUBCUTANEOUS
  Filled 2017-05-17: qty 1

## 2017-05-17 MED ORDER — SOD CITRATE-CITRIC ACID 500-334 MG/5ML PO SOLN: 30.0000 mL | ORAL | Status: DC | PRN

## 2017-05-17 MED ORDER — ACETAMINOPHEN 325 MG PO TABS
650.0000 mg | ORAL_TABLET | ORAL | Status: DC | PRN
  Filled 2017-05-17: qty 2

## 2017-05-17 MED ORDER — FENTANYL 2.5 MCG/ML BUPIVACAINE 1/10 % EPIDURAL INFUSION (WH - ANES): 14.0000 mL/h | INTRAMUSCULAR | Status: DC | PRN

## 2017-05-17 NOTE — Plan of Care (Signed)
[facility] ph unit asking about pt uc pattern. Wants to start pitocin if no labor by 12:30.

## 2017-05-17 NOTE — H&P (Signed)
Pt here for an induction for postdates. PNC was complicated by AMA. She had a nl NIPT. She has had a c/s and a successful VBAC. GBS neg  Chief Complaint: HPI:  Past Medical History:  Diagnosis Date  . AMA (advanced maternal age) multigravida 35+   . H/O foot surgery    left  . Pregnancy induced hypertension   . Seizure (HCC)    in labor w last delivery    Past Surgical History:  Procedure Laterality Date  . CESAREAN SECTION    . FOOT SURGERY      Family History  Problem Relation Age of Onset  . Fibromyalgia Mother   . Diabetes Mother   . Hypertension Mother   . Alcohol abuse Neg Hx   . Arthritis Neg Hx   . Asthma Neg Hx   . Birth defects Neg Hx   . Cancer Neg Hx   . COPD Neg Hx   . Depression Neg Hx   . Drug abuse Neg Hx   . Early death Neg Hx   . Hearing loss Neg Hx   . Heart disease Neg Hx   . Hyperlipidemia Neg Hx   . Kidney disease Neg Hx   . Learning disabilities Neg Hx   . Mental illness Neg Hx   . Mental retardation Neg Hx   . Miscarriages / Stillbirths Neg Hx   . Stroke Neg Hx   . Vision loss Neg Hx   . Varicose Veins Neg Hx    Social History:  reports that she has never smoked. She has never used smokeless tobacco. She reports that she drinks alcohol. She reports that she does not use drugs.  Allergies:  Allergies  Allergen Reactions  . Latex Itching  . Penicillins Hives  . Shellfish Allergy Rash    Medications Prior to Admission  Medication Sig Dispense Refill  . Prenatal Vit-Fe Fumarate-FA (PRENATAL MULTIVITAMIN) TABS tablet Take 1 tablet by mouth daily at 12 noon.         Blood pressure 103/67, pulse 85, temperature 98.2 F (36.8 C), temperature source Oral, resp. rate 18, height 5' 5.5" (1.664 m), weight 194 lb (88 kg), currently breastfeeding. Lungs: clear to auscultation bilaterally Abdomen: soft, non-tender; bowel sounds normal; no masses,  no organomegaly, gravid   Lab Results  Component Value Date   WBC 12.0 (H) 05/17/2017   HGB 11.2 (L) 05/17/2017   HCT 33.6 (L) 05/17/2017   MCV 84.8 05/17/2017   PLT 223 05/17/2017   Lab Results  Component Value Date   PREGTESTUR Negative 11/01/2015       Patient Active Problem List   Diagnosis Date Noted  . Post-dates pregnancy 05/17/2017  . Active labor 08/18/2014  . Supervision of other normal pregnancy 08/18/2014   IMP/ IUP at 40+ wks         Post term         AMA Plan/Admit Becky Knight E 05/17/2017, 9:08 AM

## 2017-05-17 NOTE — Anesthesia Pain Management Evaluation Note (Signed)
  CRNA Pain Management Visit Note  Patient: Becky Knight, 41 y.o., female  "Hello I am a member of the anesthesia team at [facility]. We have an anesthesia team available at all times to provide care throughout the hospital, including epidural management and anesthesia for C-section. I don't know your plan for the delivery whether it a natural birth, water birth, IV sedation, nitrous supplementation, doula or epidural, but we want to meet your pain goals."   1.Was your pain managed to your expectations on prior hospitalizations?   Yes   2.What is your expectation for pain management during this hospitalization?     Labor support without medications  3.How can we help you reach that goal? Standby  Record the patient's initial score and the patient's pain goal.   Pain: 0  Pain Goal: 10 [facility] wants you to be able to say your pain was always managed very well.  Dak Szumski 05/17/2017

## 2017-05-18 ENCOUNTER — Encounter (HOSPITAL_COMMUNITY): Payer: Self-pay

## 2017-05-18 DIAGNOSIS — Z3490 Encounter for supervision of normal pregnancy, unspecified, unspecified trimester: Secondary | ICD-10-CM

## 2017-05-18 LAB — CBC
HEMATOCRIT: 30.8 % — AB (ref 36.0–46.0)
HEMOGLOBIN: 10.5 g/dL — AB (ref 12.0–15.0)
MCH: 28.6 pg (ref 26.0–34.0)
MCHC: 34.1 g/dL (ref 30.0–36.0)
MCV: 83.9 fL (ref 78.0–100.0)
Platelets: 214 10*3/uL (ref 150–400)
RBC: 3.67 MIL/uL — AB (ref 3.87–5.11)
RDW: 14.7 % (ref 11.5–15.5)
WBC: 20.4 10*3/uL — ABNORMAL HIGH (ref 4.0–10.5)

## 2017-05-18 MED ORDER — DIBUCAINE 1 % RE OINT: 1.0000 "application " | TOPICAL_OINTMENT | RECTAL | Status: DC | PRN

## 2017-05-18 MED ORDER — IBUPROFEN 600 MG PO TABS
600.0000 mg | ORAL_TABLET | Freq: Four times a day (QID) | ORAL | Status: DC
Start: 2017-05-18 — End: 2017-05-19
  Administered 2017-05-18 – 2017-05-19 (×7): 600 mg via ORAL
  Filled 2017-05-18 (×7): qty 1

## 2017-05-18 MED ORDER — SIMETHICONE 80 MG PO CHEW: 80.0000 mg | CHEWABLE_TABLET | ORAL | Status: DC | PRN

## 2017-05-18 MED ORDER — ZOLPIDEM TARTRATE 5 MG PO TABS: 5.0000 mg | ORAL_TABLET | Freq: Every evening | ORAL | Status: DC | PRN

## 2017-05-18 MED ORDER — BENZOCAINE-MENTHOL 20-0.5 % EX AERO: 1.0000 "application " | INHALATION_SPRAY | CUTANEOUS | Status: DC | PRN

## 2017-05-18 MED ORDER — SENNOSIDES-DOCUSATE SODIUM 8.6-50 MG PO TABS
2.0000 | ORAL_TABLET | ORAL | Status: DC
  Administered 2017-05-19: 2 via ORAL
  Filled 2017-05-18: qty 2

## 2017-05-18 MED ORDER — ONDANSETRON HCL 4 MG/2ML IJ SOLN: 4.0000 mg | INTRAMUSCULAR | Status: DC | PRN

## 2017-05-18 MED ORDER — ACETAMINOPHEN 325 MG PO TABS
650.0000 mg | ORAL_TABLET | ORAL | Status: DC | PRN
  Administered 2017-05-18 (×2): 650 mg via ORAL
  Filled 2017-05-18: qty 2

## 2017-05-18 MED ORDER — ONDANSETRON HCL 4 MG PO TABS: 4.0000 mg | ORAL_TABLET | ORAL | Status: DC | PRN

## 2017-05-18 MED ORDER — WITCH HAZEL-GLYCERIN EX PADS: 1.0000 "application " | MEDICATED_PAD | CUTANEOUS | Status: DC | PRN

## 2017-05-18 MED ORDER — TETANUS-DIPHTH-ACELL PERTUSSIS 5-2.5-18.5 LF-MCG/0.5 IM SUSP: 0.5000 mL | Freq: Once | INTRAMUSCULAR | Status: DC

## 2017-05-18 MED ORDER — MEASLES, MUMPS & RUBELLA VAC ~~LOC~~ INJ
0.5000 mL | INJECTION | Freq: Once | SUBCUTANEOUS | Status: DC
  Filled 2017-05-18: qty 0.5

## 2017-05-18 MED ORDER — COCONUT OIL OIL: 1.0000 "application " | TOPICAL_OIL | Status: DC | PRN

## 2017-05-18 NOTE — Lactation Note (Signed)
This note was copied from a baby's chart. Lactation Consultation Note LC introduced self, mom stated she didn't need lactation. LC encouraged mom to call for assistance if needed or has any questions. Mom is Experienced BF mom, still BF her 39 yr old at night, which is her 2nd child. Mom has a 58 yr old that she bottle fed. Mom plans to tandem feed. Mom knows to feed baby first. Mom stated she will only be BF her 39 yr old at night and occasionally if baby wanted it prn. Mainly for comfort.  Mom states has no questions.  Mom in bed BF baby in cradle position, baby's position swaddled, nipple in mouth, w/head facing up, just suckling on tip of nipple. Encouraged to feed w/more than tip of nipple. Asked mom if painful, denied pain. Discussed transfer from just BF on tip of nipple and not obtaining a deeper latch. Mom turned baby more towards her, asked mom if can assist in feeding more of nipple in baby's mouth, mom agreed. LC attempted to do chin tug and feed more into baby's mouth. Baby received more nipple but mom cont. Not keeping baby close. Assisted in pillows under mom's arms to provide support. Encouraged comfort and support during BF. Mom encouraged to feed baby 8-12 times/24 hours and with feeding cues. Reviewed cluster feeding. Mom stated she remembered. Encouraged mom to call for assistance or questions. WH/LC brochure given w/resources, support groups and LC services.  Patient Name: Becky Knight [id number] Date: 05/18/2017 Reason for consult: Initial assessment   Maternal Data Has patient been taught Hand Expression?: Yes Does the patient have breastfeeding experience prior to this delivery?: Yes  Feeding Feeding Type: Breast Fed Length of feed: 5 min (still BF)  LATCH Score Latch: Repeated attempts needed to sustain latch, nipple held in mouth throughout feeding, stimulation needed to elicit sucking reflex.  Audible Swallowing: None  Type of Nipple: Everted at rest and after  stimulation  Comfort (Breast/Nipple): Soft / non-tender  Hold (Positioning): No assistance needed to correctly position infant at breast. (offered to assist in correcting latch. )  LATCH Score: 7  Interventions Interventions: Breast feeding basics reviewed;Support pillows;Position options;Assisted with latch (briefly assisted)  Lactation Tools Discussed/Used     Consult Status Consult Status: PRN Date: 05/19/17 Follow-up type: In-patient    Cashtyn Pouliot, Diamond Nickel 05/18/2017, 2:02 AM

## 2017-05-18 NOTE — Progress Notes (Signed)
Post Partum Day 1 Subjective: no complaints, up ad lib, voiding and tolerating PO  Objective: Blood pressure 114/64, pulse (!) 102, temperature 98.3 F (36.8 C), temperature source Oral, resp. rate 18, height 5' 5.5" (1.664 m), weight 88 kg (194 lb), SpO2 100 %, unknown if currently breastfeeding.  Physical Exam:  General: alert, cooperative and appears stated age Lochia: appropriate Uterine Fundus: firm DVT Evaluation: No evidence of DVT seen on physical exam.   Recent Labs  05/17/17 0749 05/18/17 0544  HGB 11.2* 10.5*  HCT 33.6* 30.8*    Assessment/Plan: Plan for discharge tomorrow  Pt planning for circ in the office   LOS: 1 day   Becky Knight H. 05/18/2017, 9:45 AM

## 2017-05-19 MED ORDER — DOCUSATE SODIUM 100 MG PO CAPS: 100.0000 mg | ORAL_CAPSULE | Freq: Two times a day (BID) | ORAL | 0 refills | Status: AC

## 2017-05-19 MED ORDER — OXYCODONE-ACETAMINOPHEN 5-325 MG PO TABS: 1.0000 | ORAL_TABLET | ORAL | 0 refills | Status: AC | PRN

## 2017-05-19 MED ORDER — IBUPROFEN 600 MG PO TABS: 600.0000 mg | ORAL_TABLET | Freq: Four times a day (QID) | ORAL | 0 refills | Status: AC | PRN

## 2017-06-12 NOTE — Discharge Summary (Signed)
Obstetric Discharge Summary Reason for Admission: induction of labor Prenatal Procedures: NST and ultrasound Intrapartum Procedures: spontaneous vaginal delivery Postpartum Procedures: none Complications-Operative and Postpartum: none Hemoglobin  Date Value Ref Range Status  05/18/2017 10.5 (L) 12.0 - 15.0 g/dL Final   HCT  Date Value Ref Range Status  05/18/2017 30.8 (L) 36.0 - 46.0 % Final    Physical Exam:  General: alert, cooperative and appears stated age 41: appropriate Uterine Fundus: firm DVT Evaluation: No evidence of DVT seen on physical exam.  Discharge Diagnoses: Term Pregnancy-delivered  Discharge Information: Date: 06/12/2017 Activity: pelvic rest Diet: routine Medications: Ibuprofen, Colace and Percocet Condition: improved Instructions: refer to practice specific booklet Discharge to: home   Newborn Data: Live born female  Birth Weight: 7 lb 0.5 oz (3190 g) APGAR: 9, 9  Newborn Delivery   Birth date/time:  05/17/2017 22:51:00 Delivery type:  Vaginal, Spontaneous Delivery      Home with mother.  [name]. 06/12/2017, 3:35 PM

## 2019-03-20 ENCOUNTER — Other Ambulatory Visit: Payer: Self-pay | Admitting: Obstetrics and Gynecology

## 2019-03-20 DIAGNOSIS — Z1231 Encounter for screening mammogram for malignant neoplasm of breast: Secondary | ICD-10-CM

## 2019-05-02 ENCOUNTER — Other Ambulatory Visit: Payer: Self-pay

## 2019-05-02 ENCOUNTER — Ambulatory Visit
Admission: RE | Admit: 2019-05-02 | Discharge: 2019-05-02 | Disposition: A | Discharge status: Home / Self Care | Payer: Managed Care, Other (non HMO) | Source: Ambulatory Visit | Attending: Obstetrics and Gynecology | Admitting: Obstetrics and Gynecology

## 2019-05-02 DIAGNOSIS — Z1231 Encounter for screening mammogram for malignant neoplasm of breast: Secondary | ICD-10-CM

## 2020-11-14 IMAGING — MG MM DIGITAL SCREENING BILAT W/ TOMO W/ CAD
8 series · 9 of 24 positions shown · non-contrast
Comparison: Previous exam(s).

CLINICAL DATA: Screening.

EXAM:
DIGITAL SCREENING BILATERAL MAMMOGRAM WITH TOMO AND CAD

[L MLO synth-2D]
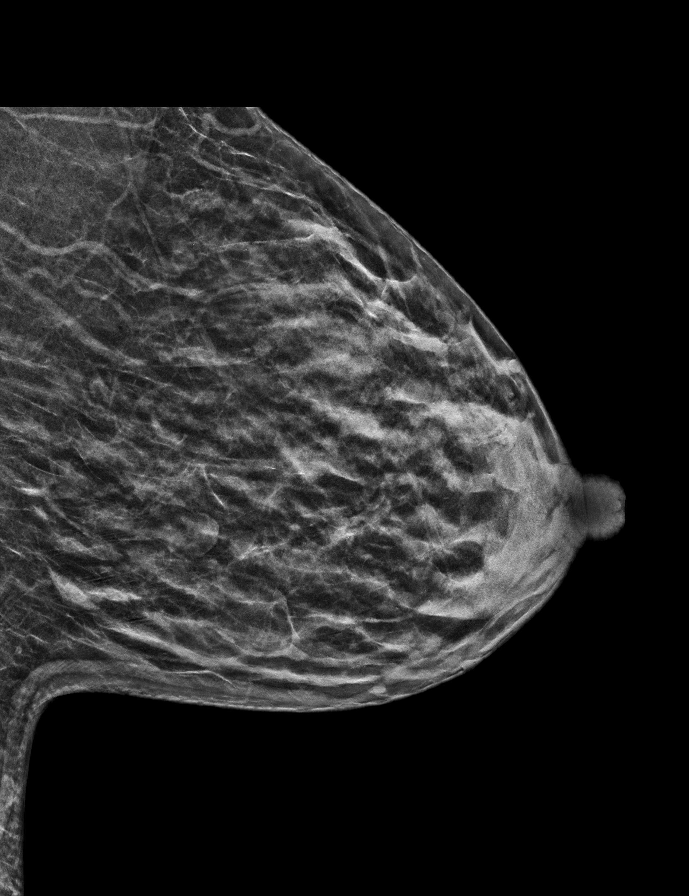

[R CC synth-2D]
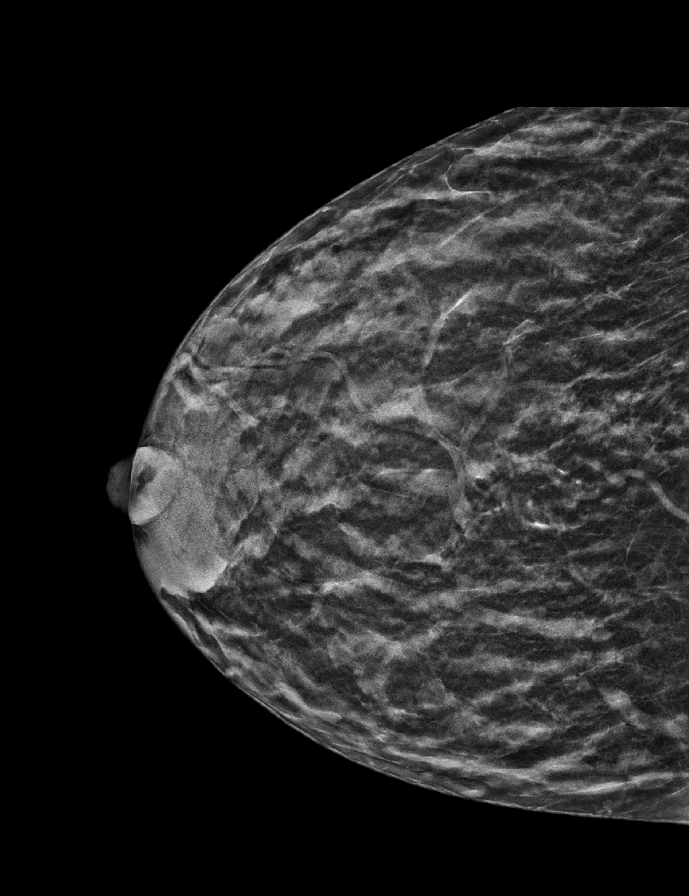

[R MLO synth-2D]
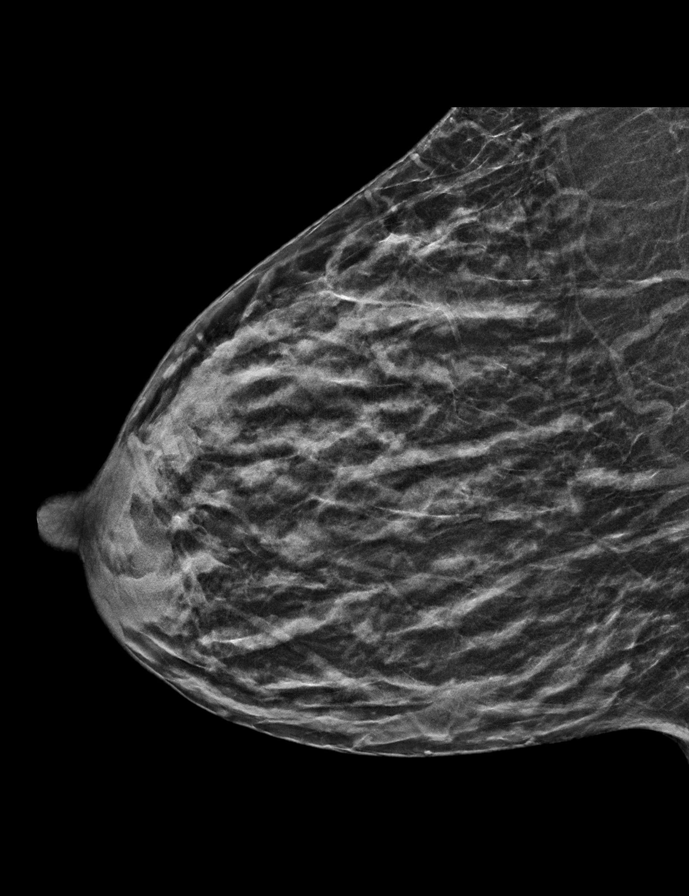

[L CC synth-2D]
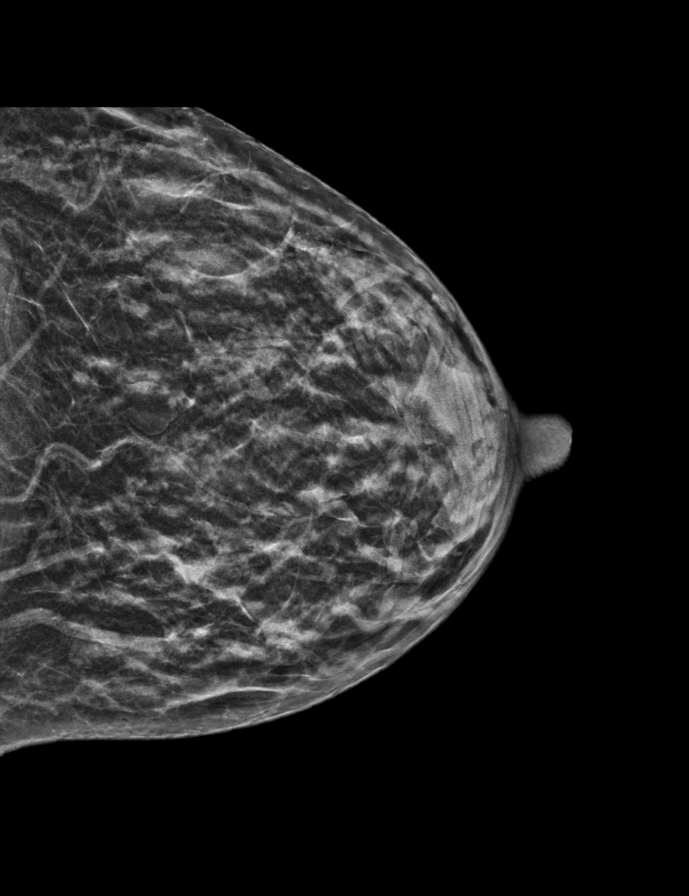

[L MLO tomo · 2 of 39 frames shown]
[frame 13/39]
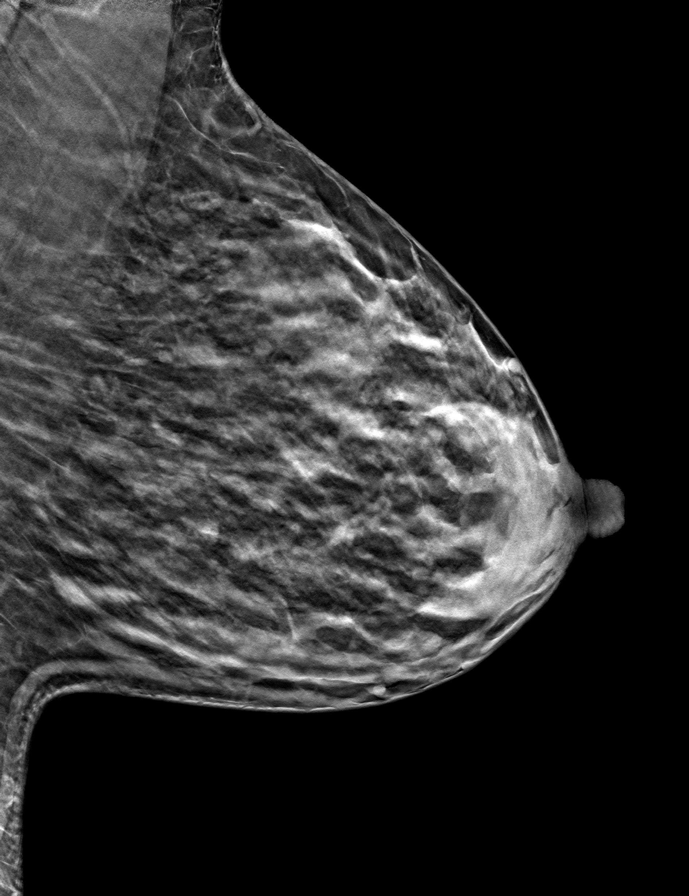
[frame 20/39]
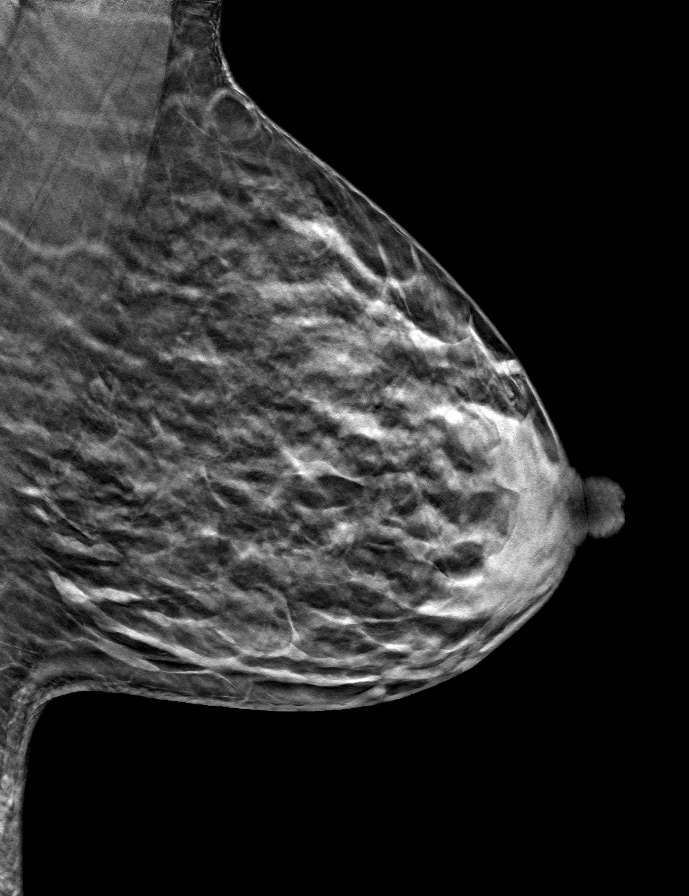

[R CC tomo · tomo slice 19/36.0]
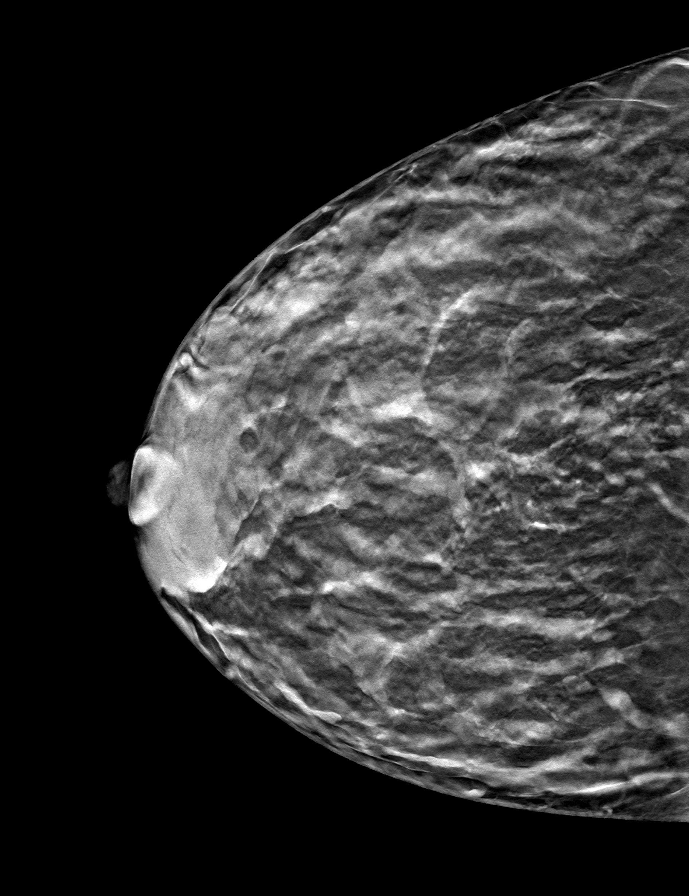

[R MLO tomo · tomo slice 20/39.0]
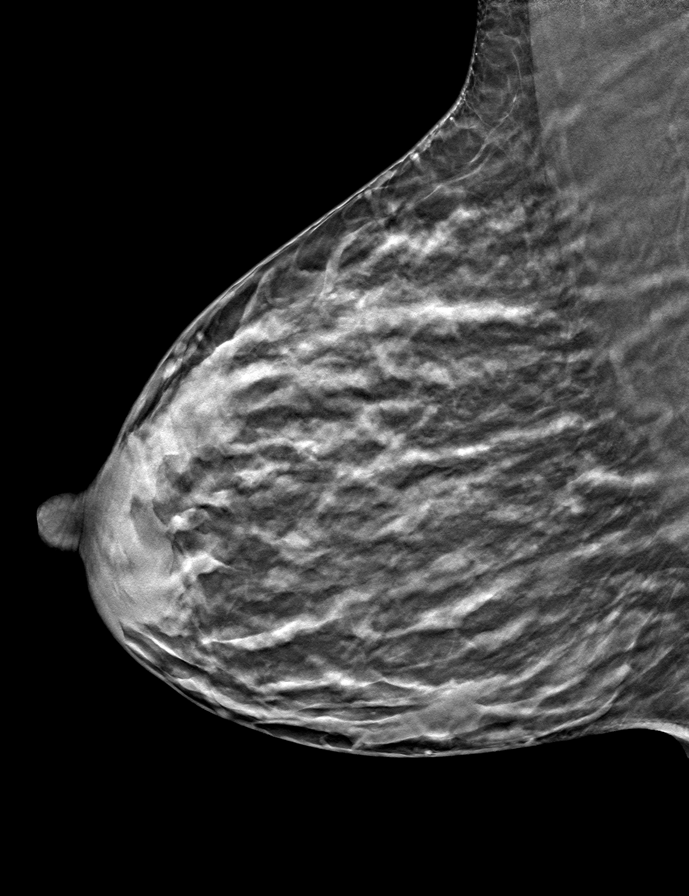

[L CC tomo · tomo slice 20/39.0]
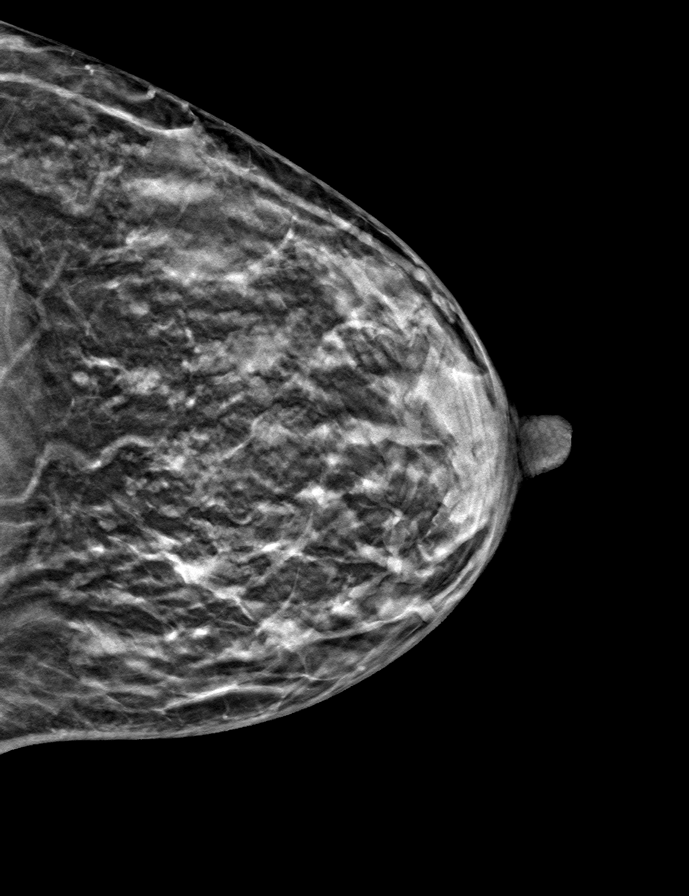

[9 of 24 positions shown; findings below may reference images not displayed]

ACR Breast Density Category c: The breast tissue is heterogeneously
dense, which may obscure small masses.
FINDINGS: There are no findings suspicious for malignancy. Images were
processed with CAD.
IMPRESSION: No mammographic evidence of malignancy. A result letter of this
screening mammogram will be mailed directly to the patient.

RECOMMENDATION:
Screening mammogram in one year. (Code:FT-U-LHB)

BI-RADS CATEGORY  1: Negative.

## 2023-02-08 ENCOUNTER — Other Ambulatory Visit: Payer: Self-pay | Admitting: Physician Assistant

## 2023-02-08 DIAGNOSIS — Z1231 Encounter for screening mammogram for malignant neoplasm of breast: Secondary | ICD-10-CM

## 2023-02-20 ENCOUNTER — Ambulatory Visit
Admission: RE | Admit: 2023-02-20 | Discharge: 2023-02-20 | Disposition: A | Discharge status: Home / Self Care | Payer: Medicaid Other | Source: Ambulatory Visit | Attending: Physician Assistant | Admitting: Physician Assistant

## 2023-02-20 DIAGNOSIS — Z1231 Encounter for screening mammogram for malignant neoplasm of breast: Secondary | ICD-10-CM

## 2023-03-01 ENCOUNTER — Encounter: Payer: Self-pay | Admitting: Physician Assistant

## 2023-05-15 ENCOUNTER — Encounter: Payer: Self-pay | Admitting: Physician Assistant

## 2023-05-15 ENCOUNTER — Ambulatory Visit: Payer: Medicaid Other | Admitting: Physician Assistant

## 2023-05-15 ENCOUNTER — Other Ambulatory Visit (INDEPENDENT_AMBULATORY_CARE_PROVIDER_SITE_OTHER): Payer: Medicaid Other

## 2023-05-15 VITALS — BP 104/60 | HR 66 | Ht 65.5 in | Wt 137.0 lb

## 2023-05-15 DIAGNOSIS — R109 Unspecified abdominal pain: Secondary | ICD-10-CM

## 2023-05-15 DIAGNOSIS — R1084 Generalized abdominal pain: Secondary | ICD-10-CM

## 2023-05-15 LAB — CBC WITH DIFFERENTIAL/PLATELET
Basophils Absolute: 0 10*3/uL (ref 0.0–0.1)
Basophils Relative: 0.3 % (ref 0.0–3.0)
Eosinophils Absolute: 0.1 10*3/uL (ref 0.0–0.7)
Eosinophils Relative: 1.1 % (ref 0.0–5.0)
HCT: 38.2 % (ref 36.0–46.0)
Hemoglobin: 12.3 g/dL (ref 12.0–15.0)
Lymphocytes Relative: 36.5 % (ref 12.0–46.0)
Lymphs Abs: 2.5 10*3/uL (ref 0.7–4.0)
MCHC: 32.2 g/dL (ref 30.0–36.0)
MCV: 86.4 fl (ref 78.0–100.0)
Monocytes Absolute: 0.6 10*3/uL (ref 0.1–1.0)
Monocytes Relative: 8.8 % (ref 3.0–12.0)
Neutro Abs: 3.7 10*3/uL (ref 1.4–7.7)
Neutrophils Relative %: 53.3 % (ref 43.0–77.0)
Platelets: 252 10*3/uL (ref 150.0–400.0)
RBC: 4.42 Mil/uL (ref 3.87–5.11)
RDW: 13.8 % (ref 11.5–15.5)
WBC: 6.9 10*3/uL (ref 4.0–10.5)

## 2023-05-15 LAB — COMPREHENSIVE METABOLIC PANEL
ALT: 9 U/L (ref 0–35)
AST: 12 U/L (ref 0–37)
Albumin: 4.2 g/dL (ref 3.5–5.2)
Alkaline Phosphatase: 56 U/L (ref 39–117)
BUN: 5 mg/dL — ABNORMAL LOW (ref 6–23)
CO2: 26 meq/L (ref 19–32)
Calcium: 9.1 mg/dL (ref 8.4–10.5)
Chloride: 107 meq/L (ref 96–112)
Creatinine, Ser: 0.66 mg/dL (ref 0.40–1.20)
GFR: 106.46 mL/min (ref >60.00)
Glucose, Bld: 88 mg/dL (ref 70–99)
Potassium: 3.5 meq/L (ref 3.5–5.1)
Sodium: 139 meq/L (ref 135–145)
Total Bilirubin: 0.5 mg/dL (ref 0.2–1.2)
Total Protein: 7.2 g/dL (ref 6.0–8.3)

## 2023-05-15 LAB — SEDIMENTATION RATE: Sed Rate: 8 mm/h (ref 0–20)

## 2023-05-15 MED ORDER — CLENPIQ 10-3.5-12 MG-GM -GM/160ML PO SOLN: 1.0000 | ORAL | 0 refills | Status: DC

## 2023-05-15 MED ORDER — DICYCLOMINE HCL 10 MG PO CAPS: ORAL_CAPSULE | ORAL | 8 refills | Status: AC

## 2023-05-15 NOTE — Progress Notes (Signed)
Subjective:    Patient ID: Becky Knight, female    DOB: 1975/12/15, 47 y.o.   MRN: [id number]  HPI Becky Knight is a 47 year old female, new to GI today referred by Dr. Ramirez/[facility] for evaluation of left-sided abdominal pain.  She had been seen by Dr. Derrell Lolling in May 2024 for evaluation of a ventral hernia just above the umbilicus in the midline.  At that time her primary complaint was left-sided abdominal pain in the left lower quadrant and at times up to the left costal margin.  Her hernia was felt to be very small and unlikely to be causing her symptoms and was therefore referred to GI. She had been seen by Dr. Bosie Clos in 2014, and believes she had a colonoscopy at that time which was negative.  I do not have that report available today. She says that she has had her current abdominal pain for several years and that there is some pain present every day though this varies in intensity and most of the time she has just dealt with it.  She does have times where the pain is intense, and had an episode about a month ago and says that generally the pain will last for hours.  She points to both the left upper quadrant and left lower quadrant and also gets pain sometimes across her ribs to the right she describes the pain as a burning type feeling she has not had any associated nausea or vomiting. Her bowel movements have been regular.  Says she has been trying to avoid acidic and spicy foods which can sometimes aggravate the symptoms.  Otherwise unaware of any specific food triggers. He does not feel that she is lactose intolerant. No melena or hematochezia. No regular heartburn or indigestion, no dysphagia. She is status post prior C-section Family history negative for GI disease as far she is aware.  CT of the abdomen pelvis was done with contrast in 2017 and unremarkable.  Review of Systems Pertinent positive and negative review of systems were noted in the above HPI section.  All  other review of systems was otherwise negative.   Outpatient Encounter Medications as of 05/15/2023  Medication Sig   dicyclomine (BENTYL) 10 MG capsule Take one capsule every morning, then one capsule every 6 hours as needed   Sod Picosulfate-Mag Ox-Cit Acd (CLENPIQ) 10-3.5-12 MG-GM -GM/160ML SOLN Take 1 kit by mouth as directed.   docusate sodium (COLACE) 100 MG capsule Take 1 capsule (100 mg total) by mouth 2 (two) times daily. (Patient not taking: Reported on 05/15/2023)   ibuprofen (ADVIL,MOTRIN) 600 MG tablet Take 1 tablet (600 mg total) by mouth every 6 (six) hours as needed. (Patient not taking: Reported on 05/15/2023)   oxyCODONE-acetaminophen (ROXICET) 5-325 MG tablet Take 1-2 tablets by mouth every 4 (four) hours as needed for severe pain. (Patient not taking: Reported on 05/15/2023)   Prenatal Vit-Fe Fumarate-FA (PRENATAL MULTIVITAMIN) TABS tablet Take 1 tablet by mouth daily at 12 noon. (Patient not taking: Reported on 05/15/2023)   No facility-administered encounter medications on file as of 05/15/2023.   Allergies  Allergen Reactions   Latex Itching   Penicillins Hives    Has patient had a PCN reaction causing immediate rash, facial/tongue/throat swelling, SOB or lightheadedness with hypotension: Yes Has patient had a PCN reaction causing severe rash involving mucus membranes or skin necrosis: No Has patient had a PCN reaction that required hospitalization: Yes Has patient had a PCN reaction occurring within the last 10  years: No If all of the above answers are "NO", then may proceed with Cephalosporin use.   Shellfish Allergy Rash   Patient Active Problem List   Diagnosis Date Noted   Pregnancy 05/18/2017   Post-dates pregnancy 05/17/2017   Active labor 08/18/2014   Supervision of other normal pregnancy 08/18/2014   Social History   Socioeconomic History   Marital status: Single    Spouse name: Not on file   Number of children: 3   Years of education: Not on file    Highest education level: Not on file  Occupational History   Occupation: home maker  Tobacco Use   Smoking status: Never   Smokeless tobacco: Never  Vaping Use   Vaping status: Never Used  Substance and Sexual Activity   Alcohol use: Never    Comment: occasionally   Drug use: No   Sexual activity: Yes  Other Topics Concern   Not on file  Social History Narrative   Not on file   Social Determinants of Health   Financial Resource Strain: Not on file  Food Insecurity: Not on file  Transportation Needs: Not on file  Physical Activity: Not on file  Stress: Not on file  Social Connections: Not on file  Intimate Partner Violence: Not on file    Becky Knight's family history includes Diabetes in her mother; Fibromyalgia in her mother; Hypertension in her mother.      Objective:    Vitals:   05/15/23 1042  BP: 104/60  Pulse: 66    Physical Exam Well-developed well-nourished African-American female in no acute distress.  Height, Weight 137, BMI 22.4  HEENT; nontraumatic normocephalic, EOMI, PE R LA, sclera anicteric. Oropharynx; not examined today Neck; supple, no JVD Cardiovascular; regular rate and rhythm with S1-S2, no murmur rub or gallop Pulmonary; Clear bilaterally Abdomen; soft, no focal tenderness, nondistended, no palpable mass or hepatosplenomegaly, bowel sounds are active, small supraumbilical hernia, C-section incisional scar Rectal; not done today Skin; benign exam, no jaundice rash or appreciable lesions Extremities; no clubbing cyanosis or edema skin warm and dry Neuro/Psych; alert and oriented x4, grossly nonfocal mood and affect appropriate        Assessment & Plan:   #32 47 year old African-American female with several year history of primarily left-sided abdominal pain which could be located in the left upper quadrant left mid and left lower quadrant and sometimes will radiate to the right. Some discomfort present on a daily basis, varies in intensity  and occasionally can be more severe. No associated nausea or vomiting, bowel movements regular  CT abdomen and pelvis 2017 negative Remote colonoscopy negative  Etiology of pain is not clear.  She did have recent surgical evaluation per Dr. Derrell Lolling for a small supraumbilical hernia which is not felt to be the etiology of her current symptoms. This may be more IBS related, rule out component secondary to adhesions status post C-section rule out underlying IBD ,rule out chronic gastropathy/left upper quadrant pain, rule out celiac  Plan; CBC with differential, c-Met, TTG and IgA, sed rate Start trial of dicyclomine 10 mg p.o. every morning, then every 6 hours as needed thereafter Patient has signed a release and will obtain copies of her prior GI records/[facility] Will schedule for colonoscopy and EGD with Dr. Barron Alvine.  Both procedures were discussed in detail with the patient including indications risks and benefits and she is agreeable to proceed. Patient will be established with Dr. Barron Alvine, further recommendations pending results of above  Becky Knight  S Becky Thelin PA-C 05/15/2023   Cc: Axel Filler, MD

## 2023-05-15 NOTE — Patient Instructions (Addendum)
_______________________________________________________  If your blood pressure at your visit was 140/90 or greater, please contact your primary care physician to follow up on this.  If you are age 45 or younger, your body mass index should be between 19-25. Your Body mass index is 22.45 kg/m. If this is out of the aformentioned range listed, please consider follow up with your Primary Care Provider.  ________________________________________________________  The [facility] providers would like to encourage you to use [facility] to communicate with providers for non-urgent requests or questions.  Due to long hold times on the telephone, sending your provider a message by [facility] may be a faster and more efficient way to get a response.  Please allow 48 business hours for a response.  Please remember that this is for non-urgent requests.  _______________________________________________________  Your provider has requested that you go to the basement level for lab work before leaving today. Press "B" on the elevator. The lab is located at the first door on the left as you exit the elevator.  We have sent the following medications to your pharmacy for you to pick up at your convenience:  START: Bentyl 10mg  one capsule every morning, then one capsule every 6 hours as needed.  You have been scheduled for an endoscopy and colonoscopy. Please follow the written instructions given to you at your visit today.  Please pick up your prep supplies at the pharmacy within the next 1-3 days.  If you use inhalers (even only as needed), please bring them with you on the day of your procedure.  DO NOT TAKE 7 DAYS PRIOR TO TEST- Trulicity (dulaglutide) Ozempic, Wegovy (semaglutide) Mounjaro (tirzepatide) Bydureon Bcise (exanatide extended release)  DO NOT TAKE 1 DAY PRIOR TO YOUR TEST Rybelsus (semaglutide) Adlyxin (lixisenatide) Victoza (liraglutide) Byetta  (exanatide) ___________________________________________________________________________  Due to recent changes in healthcare laws, you may see the results of your imaging and laboratory studies on MyChart before your provider has had a chance to review them.  We understand that in some cases there may be results that are confusing or concerning to you. Not all laboratory results come back in the same time frame and the provider may be waiting for multiple results in order to interpret others.  Please give [place] 48 hours in order for your provider to thoroughly review all the results before contacting the office for clarification of your results.   Thank you for entrusting me with your care and choosing [facility].  Amy Esterwood, PA-C

## 2023-05-16 LAB — IGA: Immunoglobulin A: 251 mg/dL (ref 47–310)

## 2023-05-16 LAB — TISSUE TRANSGLUTAMINASE, IGA: (tTG) Ab, IgA: <1 U/mL

## 2023-05-22 NOTE — Progress Notes (Signed)
Agree with the assessment and plan as outlined by Mike Gip, PA-C.  Kynleigh Artz, DO, [facility]

## 2023-06-07 ENCOUNTER — Telehealth: Payer: Self-pay

## 2023-06-07 NOTE — Telephone Encounter (Signed)
Received 6 pages of Colonoscopy and surgical pathology reports from [facility].  Records put in Amy's inbox for her review.

## 2023-07-09 ENCOUNTER — Ambulatory Visit (AMBULATORY_SURGERY_CENTER): Payer: Medicaid Other | Admitting: Gastroenterology

## 2023-07-09 ENCOUNTER — Encounter: Payer: Self-pay | Admitting: Gastroenterology

## 2023-07-09 VITALS — BP 101/61 | HR 72 | Temp 97.5°F | Resp 16 | Ht 65.0 in | Wt 137.0 lb

## 2023-07-09 DIAGNOSIS — D123 Benign neoplasm of transverse colon: Secondary | ICD-10-CM

## 2023-07-09 DIAGNOSIS — D125 Benign neoplasm of sigmoid colon: Secondary | ICD-10-CM

## 2023-07-09 DIAGNOSIS — R1012 Left upper quadrant pain: Secondary | ICD-10-CM | POA: Diagnosis not present

## 2023-07-09 DIAGNOSIS — R109 Unspecified abdominal pain: Secondary | ICD-10-CM

## 2023-07-09 MED ORDER — SODIUM CHLORIDE 0.9 % IV SOLN: 500.0000 mL | INTRAVENOUS | Status: DC

## 2023-07-09 NOTE — Progress Notes (Signed)
Sedate, gd SR, tolerated procedure well, VSS, report to RN 

## 2023-07-09 NOTE — Progress Notes (Signed)
Called to room to assist during endoscopic procedure.  Patient ID and intended procedure confirmed with present staff. Received instructions for my participation in the procedure from the performing physician.  

## 2023-07-09 NOTE — Op Note (Signed)
[facility] Patient Name: Becky Knight Procedure Date: 07/09/2023 10:37 AM MRN: [id number] Endoscopist: Doristine Locks , MD, [id number] Age: 47 Referring MD:  Date of Birth: 18-Dec-1975 Gender: Female Account #: [id number] Procedure:                Colonoscopy Indications:              Abdominal pain in the left lower quadrant,                            Abdominal pain in the left upper quadrant Medicines:                Monitored Anesthesia Care Procedure:                Pre-Anesthesia Assessment:                           - Prior to the procedure, a History and Physical                            was performed, and patient medications and                            allergies were reviewed. The patient's tolerance of                            previous anesthesia was also reviewed. The risks                            and benefits of the procedure and the sedation                            options and risks were discussed with the patient.                            All questions were answered, and informed consent                            was obtained. Prior Anticoagulants: The patient has                            taken no anticoagulant or antiplatelet agents. ASA                            Grade Assessment: I - A normal, healthy patient.                            After reviewing the risks and benefits, the patient                            was deemed in satisfactory condition to undergo the                            procedure.  After obtaining informed consent, the colonoscope                            was passed under direct vision. Throughout the                            procedure, the patient's blood pressure, pulse, and                            oxygen saturations were monitored continuously. The                            [id number]-[id number] Colonoscope [id number] was introduced                            through the anus and advanced  approximately 10 cm                            into the the terminal ileum. The colonoscopy was                            performed without difficulty. The patient tolerated                            the procedure well. The quality of the bowel                            preparation was good. The terminal ileum, ileocecal                            valve, appendiceal orifice, and rectum were                            photographed. Scope In: 10:53:50 AM Scope Out: 11:13:40 AM Scope Withdrawal Time: 0 hours 15 minutes 58 seconds  Total Procedure Duration: 0 hours 19 minutes 50 seconds  Findings:                 The perianal and digital rectal examinations were                            normal.                           Three mucous-capped and semi-sessile polyps were                            found in the transverse colon. The polyps were 4 to                            6 mm in size. These polyps were removed with a cold                            snare. Resection and retrieval were complete.  Estimated blood loss was minimal.                           Two sessile polyps were found in the sigmoid colon.                            The polyps were 3 to 4 mm in size. These polyps                            were removed with a cold snare. Resection and                            retrieval were complete. Estimated blood loss was                            minimal.                           Normal mucosa was found in the entire colon. No                            areas of mucosal erythema, edema, erosions, or                            ulceration.                           The retroflexed view of the distal rectum and anal                            verge was normal and showed no anal or rectal                            abnormalities.                           The terminal ileum appeared normal. Complications:            No immediate complications. Estimated  Blood Loss:     Estimated blood loss was minimal. Impression:               - Three 4 to 6 mm polyps in the transverse colon,                            removed with a cold snare. Resected and retrieved.                           - Two 3 to 4 mm polyps in the sigmoid colon,                            removed with a cold snare. Resected and retrieved.                           - Normal mucosa in the entire examined colon.                           -  The distal rectum and anal verge are normal on                            retroflexion view.                           - The examined portion of the ileum was normal. Recommendation:           - Patient has a contact number available for                            emergencies. The signs and symptoms of potential                            delayed complications were discussed with the                            patient. Return to normal activities tomorrow.                            Written discharge instructions were provided to the                            patient.                           - Resume previous diet.                           - Continue present medications.                           - Await pathology results.                           - Repeat colonoscopy for surveillance based on                            pathology results.                           - Return to GI office PRN.                           - No clear mucosal or luminal etiology for                            abdominal pain on the EGD or Colonoscopy today. Doristine Locks, MD 07/09/2023 11:23:48 AM

## 2023-07-09 NOTE — Progress Notes (Signed)
GASTROENTEROLOGY PROCEDURE H&P NOTE   Primary Care Physician: Patient, No Pcp Per    Reason for Procedure:   Left sided abdominal pain  Plan:    EGD, colonoscopy  Patient is appropriate for endoscopic procedure(s) in the ambulatory ([facility]) setting.  The nature of the procedure, as well as the risks, benefits, and alternatives were carefully and thoroughly reviewed with the patient. Ample time for discussion and questions allowed. The patient understood, was satisfied, and agreed to proceed.     HPI: Becky Knight is a 47 y.o. female who presents for EGD and colonoscopy for evaluation of left-sided abdominal pain.  Symptoms have been present intermittently for a number of years.  Prior CT unremarkable.  Recent normal CBC, CMP, ESR, celiac panel.  No associated changes in bowel habits, overt bleeding.  No appreciable improvement with trial of dicyclomine.  Past Medical History:  Diagnosis Date   AMA (advanced maternal age) multigravida 35+    H/O foot surgery    left   Pregnancy induced hypertension    Seizure (HCC)    in labor w last delivery    Past Surgical History:  Procedure Laterality Date   CESAREAN SECTION     FOOT SURGERY      Prior to Admission medications   Medication Sig Start Date End Date Taking? Authorizing Provider  dicyclomine (BENTYL) 10 MG capsule Take one capsule every morning, then one capsule every 6 hours as needed 05/15/23   Esterwood, Amy S, PA-C  docusate sodium (COLACE) 100 MG capsule Take 1 capsule (100 mg total) by mouth 2 (two) times daily. Patient not taking: Reported on 05/15/2023 05/19/17   Waynard Reeds, MD  ibuprofen (ADVIL,MOTRIN) 600 MG tablet Take 1 tablet (600 mg total) by mouth every 6 (six) hours as needed. Patient not taking: Reported on 05/15/2023 05/19/17   Waynard Reeds, MD  oxyCODONE-acetaminophen (ROXICET) 5-325 MG tablet Take 1-2 tablets by mouth every 4 (four) hours as needed for severe pain. Patient not taking: Reported on  05/15/2023 05/19/17   Waynard Reeds, MD  Prenatal Vit-Fe Fumarate-FA (PRENATAL MULTIVITAMIN) TABS tablet Take 1 tablet by mouth daily at 12 noon. Patient not taking: Reported on 05/15/2023    [provider]    Current Outpatient Medications  Medication Sig Dispense Refill   dicyclomine (BENTYL) 10 MG capsule Take one capsule every morning, then one capsule every 6 hours as needed 90 capsule 8   docusate sodium (COLACE) 100 MG capsule Take 1 capsule (100 mg total) by mouth 2 (two) times daily. (Patient not taking: Reported on 05/15/2023) 60 capsule 0   ibuprofen (ADVIL,MOTRIN) 600 MG tablet Take 1 tablet (600 mg total) by mouth every 6 (six) hours as needed. (Patient not taking: Reported on 05/15/2023) 90 tablet 0   oxyCODONE-acetaminophen (ROXICET) 5-325 MG tablet Take 1-2 tablets by mouth every 4 (four) hours as needed for severe pain. (Patient not taking: Reported on 05/15/2023) 20 tablet 0   Prenatal Vit-Fe Fumarate-FA (PRENATAL MULTIVITAMIN) TABS tablet Take 1 tablet by mouth daily at 12 noon. (Patient not taking: Reported on 05/15/2023)     Current Facility-Administered Medications  Medication Dose Route Frequency Provider Last Rate Last Admin   0.9 %  sodium chloride infusion  500 mL Intravenous Continuous Rubie Ficco V, DO        Allergies as of 07/09/2023 - Review Complete 07/09/2023  Allergen Reaction Noted   Latex Itching 05/16/2017   Penicillins Hives 10/31/2011   Shellfish allergy Rash 05/16/2017  Family History  Problem Relation Age of Onset   Fibromyalgia Mother    Diabetes Mother    Hypertension Mother    Alcohol abuse Neg Hx    Arthritis Neg Hx    Asthma Neg Hx    Birth defects Neg Hx    Cancer Neg Hx    COPD Neg Hx    Depression Neg Hx    Drug abuse Neg Hx    Early death Neg Hx    Hearing loss Neg Hx    Heart disease Neg Hx    Hyperlipidemia Neg Hx    Kidney disease Neg Hx    Learning disabilities Neg Hx    Mental illness Neg Hx    Mental  retardation Neg Hx    Miscarriages / Stillbirths Neg Hx    Stroke Neg Hx    Vision loss Neg Hx    Varicose Veins Neg Hx     Social History   Socioeconomic History   Marital status: Single    Spouse name: Not on file   Number of children: 3   Years of education: Not on file   Highest education level: Not on file  Occupational History   Occupation: home maker  Tobacco Use   Smoking status: Never   Smokeless tobacco: Never  Vaping Use   Vaping status: Never Used  Substance and Sexual Activity   Alcohol use: Never    Comment: occasionally   Drug use: No   Sexual activity: Yes  Other Topics Concern   Not on file  Social History Narrative   Not on file   Social Determinants of Health   Financial Resource Strain: Not on file  Food Insecurity: Not on file  Transportation Needs: Not on file  Physical Activity: Not on file  Stress: Not on file  Social Connections: Not on file  Intimate Partner Violence: Not on file    Physical Exam: Vital signs in last 24 hours: @BP  102/61   Pulse 67   Temp (!) 97.5 F (36.4 C)   Ht 5\' 5"  (1.651 m)   Wt 137 lb (62.1 kg)   SpO2 100%   BMI 22.80 kg/m  GEN: NAD EYE: Sclerae anicteric ENT: MMM CV: Non-tachycardic Pulm: CTA b/l GI: Soft, NT/ND NEURO:  Alert & Oriented x 3   Doristine Locks, DO  Gastroenterology   07/09/2023 10:35 AM

## 2023-07-09 NOTE — Op Note (Signed)
[facility] Patient Name: Becky Knight Procedure Date: 07/09/2023 10:37 AM MRN: [id number] Endoscopist: Doristine Locks , MD, [id number] Age: 47 Referring MD:  Date of Birth: 08-13-1976 Gender: Female Account #: [id number] Procedure:                Upper GI endoscopy Indications:              Abdominal pain in the left upper quadrant,                            Abdominal pain in the left lower quadrant Medicines:                Monitored Anesthesia Care Procedure:                Pre-Anesthesia Assessment:                           - Prior to the procedure, a History and Physical                            was performed, and patient medications and                            allergies were reviewed. The patient's tolerance of                            previous anesthesia was also reviewed. The risks                            and benefits of the procedure and the sedation                            options and risks were discussed with the patient.                            All questions were answered, and informed consent                            was obtained. Prior Anticoagulants: The patient has                            taken no anticoagulant or antiplatelet agents. ASA                            Grade Assessment: I - A normal, healthy patient.                            After reviewing the risks and benefits, the patient                            was deemed in satisfactory condition to undergo the                            procedure.  After obtaining informed consent, the endoscope was                            passed under direct vision. Throughout the                            procedure, the patient's blood pressure, pulse, and                            oxygen saturations were monitored continuously. The                            Olympus Scope [id number] was introduced through the                            mouth, and advanced to the  third part of duodenum.                            The upper GI endoscopy was accomplished without                            difficulty. The patient tolerated the procedure                            well. Scope In: Scope Out: Findings:                 The examined esophagus was normal.                           The Z-line was regular and was found 37 cm from the                            incisors.                           The gastroesophageal flap valve was visualized                            endoscopically and classified as Hill Grade III                            (minimal fold, loose to endoscope, hiatal hernia                            likely).                           The entire examined stomach was normal. Biopsies                            were taken with a cold forceps for histology and                            Helicobacter pylori testing. Estimated blood loss  was minimal.                           The examined duodenum was normal. Biopsies were                            taken with a cold forceps for histology. Estimated                            blood loss was minimal. Complications:            No immediate complications. Estimated Blood Loss:     Estimated blood loss was minimal. Impression:               - Normal esophagus.                           - Z-line regular, 37 cm from the incisors.                           - Gastroesophageal flap valve classified as Hill                            Grade III (minimal fold, loose to endoscope, hiatal                            hernia likely).                           - Normal stomach. Biopsied.                           - Normal examined duodenum. Biopsied. Recommendation:           - Patient has a contact number available for                            emergencies. The signs and symptoms of potential                            delayed complications were discussed with the                             patient. Return to normal activities tomorrow.                            Written discharge instructions were provided to the                            patient.                           - Resume previous diet.                           - Continue present medications.                           -  Await pathology results.                           - Perform a colonoscopy today. Doristine Locks, MD 07/09/2023 11:17:07 AM

## 2023-07-09 NOTE — Patient Instructions (Addendum)
Thank you for letting us take care of your healthcare needs today. Please see handouts given to you on Polyps. Await pathology results.     YOU HAD AN ENDOSCOPIC PROCEDURE TODAY AT THE [facility]:   Refer to the procedure report that was given to you for any specific questions about what was found during the examination.  If the procedure report does not answer your questions, please call your gastroenterologist to clarify.  If you requested that your care partner not be given the details of your procedure findings, then the procedure report has been included in a sealed envelope for you to review at your convenience later.  YOU SHOULD EXPECT: Some feelings of bloating in the abdomen. Passage of more gas than usual.  Walking can help get rid of the air that was put into your GI tract during the procedure and reduce the bloating. If you had a lower endoscopy (such as a colonoscopy or flexible sigmoidoscopy) you may notice spotting of blood in your stool or on the toilet paper. If you underwent a bowel prep for your procedure, you may not have a normal bowel movement for a few days.  Please Note:  You might notice some irritation and congestion in your nose or some drainage.  This is from the oxygen used during your procedure.  There is no need for concern and it should clear up in a day or so.  SYMPTOMS TO REPORT IMMEDIATELY:  Following lower endoscopy (colonoscopy or flexible sigmoidoscopy):  Excessive amounts of blood in the stool  Significant tenderness or worsening of abdominal pains  Swelling of the abdomen that is new, acute  Fever of 100F or higher  Following upper endoscopy (EGD)  Vomiting of blood or coffee ground material  New chest pain or pain under the shoulder blades  Painful or persistently difficult swallowing  New shortness of breath  Fever of 100F or higher  Black, tarry-looking stools  For urgent or emergent issues, a gastroenterologist can be reached at  any hour by calling [phone]. Do not use MyChart messaging for urgent concerns.    DIET:  We do recommend a small meal at first, but then you may proceed to your regular diet.  Drink plenty of fluids but you should avoid alcoholic beverages for 24 hours.  ACTIVITY:  You should plan to take it easy for the rest of today and you should NOT DRIVE or use heavy machinery until tomorrow (because of the sedation medicines used during the test).    FOLLOW UP: Our staff will call the number listed on your records the next business day following your procedure.  We will call around 7:15- 8:00 am to check on you and address any questions or concerns that you may have regarding the information given to you following your procedure. If we do not reach you, we will leave a message.     If any biopsies were taken you will be contacted by phone or by letter within the next 1-3 weeks.  Please call us at [phone] if you have not heard about the biopsies in 3 weeks.    SIGNATURES/CONFIDENTIALITY: You and/or your care partner have signed paperwork which will be entered into your electronic medical record.  These signatures attest to the fact that that the information above on your After Visit Summary has been reviewed and is understood.  Full responsibility of the confidentiality of this discharge information lies with you and/or your care-partner.

## 2023-07-09 NOTE — Progress Notes (Signed)
Pt's states no medical or surgical changes since previsit or office visit. 

## 2023-07-10 ENCOUNTER — Telehealth: Payer: Self-pay

## 2023-07-10 NOTE — Telephone Encounter (Signed)
Left message on answering machine. 

## 2023-07-12 LAB — SURGICAL PATHOLOGY
